# Patient Record
Sex: Male | Born: 1960 | Race: Black or African American | Hispanic: No | Marital: Single | Smoking: Former smoker
Health system: Southern US, Community
[De-identification: ages and names within clinical notes are randomized; demographics above are authoritative.]

## PROBLEM LIST (undated history)

## (undated) DIAGNOSIS — E7800 Pure hypercholesterolemia, unspecified: Secondary | ICD-10-CM

## (undated) DIAGNOSIS — E119 Type 2 diabetes mellitus without complications: Secondary | ICD-10-CM

## (undated) DIAGNOSIS — I10 Essential (primary) hypertension: Secondary | ICD-10-CM

## (undated) HISTORY — DX: Pure hypercholesterolemia, unspecified: E78.00

## (undated) HISTORY — PX: BACK SURGERY: SHX140

---

## 2006-07-06 ENCOUNTER — Emergency Department: Payer: Self-pay | Admitting: Emergency Medicine

## 2007-04-28 ENCOUNTER — Inpatient Hospital Stay (HOSPITAL_COMMUNITY): Admission: RE | Admit: 2007-04-28 | Discharge: 2007-05-01 | Payer: Self-pay | Admitting: Orthopaedic Surgery

## 2007-11-09 ENCOUNTER — Emergency Department: Payer: Self-pay | Admitting: Internal Medicine

## 2010-12-04 ENCOUNTER — Ambulatory Visit: Payer: Self-pay

## 2011-01-06 ENCOUNTER — Emergency Department: Payer: Self-pay | Admitting: Emergency Medicine

## 2011-03-13 ENCOUNTER — Emergency Department: Payer: Self-pay | Admitting: Emergency Medicine

## 2011-03-31 NOTE — Op Note (Signed)
Jack Nichols, Jack Nichols           ACCOUNT NO.:  [id number]   MEDICAL RECORD NO.:  [id number]          PATIENT TYPE:  INP   LOCATION:  2899                         FACILITY:  [facility]   PHYSICIAN:  Sharolyn Douglas, M.D.        DATE OF BIRTH:  1961-08-30   DATE OF PROCEDURE:  04/28/2007  DATE OF DISCHARGE:                               OPERATIVE REPORT   DIAGNOSIS:  L5-S1 degenerative disk disease, chronic back and bilateral  lower extremity pain.   PROCEDURES:  1. L5-S1 hemilaminotomy and diskectomy.  2. L5-S1 posterior spinal fusion.  3. L5-S1 pedicle screw instrumentation using Abbott Spine system.  4. Transforaminal lumbar interbody fusion L5-S1, with placement of      PEEK cage.  5. Local autogenous bone graft supplemented with BMP.   SURGEON:  Sharolyn Douglas, M.D.   ASSISTANT:  Verlin Fester, P.A.   ANESTHESIA:  General endotracheal.   ESTIMATED BLOOD LOSS:  100 cc.   COMPLICATIONS:  None.   NEEDLE AND SPONGE COUNT:  Correct.   INDICATIONS:  The patient is a pleasant 50 year old male with chronic  persistent back and bilateral lower extremity pain.  His imaging studies  show severe degenerative changes at L5-S1, with disc phenomenon system  and on and disc space narrowing.  He has a central disc herniation which  appears chronic.  He has failed to respond to other conservative  treatment modalities and now presents for L5-S1 decompression.  Risks,  benefits, and alternatives were reviewed.   PROCEDURE:  After informed consent, he was taken to the operating room.  He underwent general endotracheal anesthesia without difficulty and  given prophylactic IV antibiotics.  Neuro monitoring was established in  the form of the SSPs and lower extremity EMGs.  He was turned prone onto  the [place] frame.  All bony prominences were padded.  Face and eyes were  protected at all times.  Back prepped and draped in the usual sterile  fashion.  Midline incision made from L4 down to S1.   Dissection was  carried sharply through the deep fascia.  Subperiosteal exposure carried  out to the tips the transverse processes of L5 and the sacral ala  bilaterally.  Intraoperative x-ray was taken to confirm the levels.  Deep retractor was placed.  We placed pedicle screws at L5 and S1 using  anatomic probing technique.  We placed 6.5 x 45 mm screws at L5 and 7.5  x 35 mm screws in the sacrum.  The screw purchase was good, and the bone  quality was excellent.  We stimulated each pedicle screw using triggered  EMGs, there were no deleterious changes.  Before placing pedicle screws,  we palpated the pedicle tracts, and there were no breaches.  In  addition, we decorticated the transverse processes in the sacral ala in  preparation for the arthrodesis.  At this point, we turned our attention  to performing a left hemilaminectomy.  The ligamentum flavum was removed  piecemeal.  The inferior two-thirds of the spinous process and lamina  were removed using the high-speed bur and Kerrison punches.  The lateral  border of  thecal sac was identified, and the decompression was carried  out laterally flush with the pedicles.  We then undercut the lamina  across to the contralateral side.  At this point, we elected to proceed  with a transforaminal lumbar interbody fusion in order to remove the  pathologic disc at L5-S1,  improve the foraminal height, and improve the  fusion rate.  The remaining facette joint was osteotomized.  The exiting  transversing nerve roots were identified.  Free running EMGs were  monitored.  Disc space was entered, and a radical diskectomy was  completed.  The disc space was dilated up to 9 mm using intervertebral  spreaders.  The cartilaginous endplates were scraped clean.  The disc  space was then packed with local bone graft from the laminectomy along  with BMP sponges.  A 9 mm PEEK cage was packed with a BMP sponge  inserted into the interspace, tamped anteriorly  across the midline.  There were no changes in the free running EMGs.  Hemostasis was  achieved.  We placed a 40 mm titanium rod into the polyaxial screw heads  bilaterally, and compression was applied before shearing off the locking  caps.  The remaining local bone graft was packed tightly into the  lateral gutters between the L5 transverse process and the sacral ala.  A  Hemovac drain was left in place deep.  The deep fascia was closed with a  running #1 Vicryl suture.  The subcutaneous layer was closed with 2-0  Vicryl and 0 Vicryl, and then the subcuticular layer was closed with a  running 3-0 subcuticular Vicryl suture on the skin edges.  Dermabond was  applied.  Sterile dressing was applied.  The patient was turned supine,  extubated without difficulty, and transferred to recovery in stable  condition.   It should be noted that my assistant, [employer], P.A. was present  throughout procedure, including the positioning.  She then helped me  with the exposure using the Cobb elevators and the suction.  She  assisted me with the laminectomy and also with the TLIF procedure by  providing retraction of the nerve roots and providing suction where  needed.  She then assisted me with the instrumentation and fusion and  also with wound closure.  It should also be noted that intraoperative x-  ray was taken at the completion of the procedure which showed acceptable  positioning of the instrumentation at L5-S1.      Sharolyn Douglas, M.D.  Electronically Signed     MC/MEDQ  D:  04/28/2007  T:  04/29/2007  Job:  [id number]

## 2011-04-03 NOTE — Discharge Summary (Signed)
NAMECOREYON, NICOTRA           ACCOUNT NO.:  [id number]   MEDICAL RECORD NO.:  [id number]          PATIENT TYPE:  INP   LOCATION:  5030                         FACILITY:  [facility]   PHYSICIAN:  Sharolyn Douglas, M.D.        DATE OF BIRTH:  February 22, 1961   DATE OF ADMISSION:  04/28/2007  DATE OF DISCHARGE:  05/01/2007                               DISCHARGE SUMMARY   ADMITTING DIAGNOSES:  1. L5-S1 degenerative disease and chronic back pain.  2. Possible hypertension.   DISCHARGE DIAGNOSES:  1. Status post L5-S1 posterior spinal fusion.  2. Postoperative blood loss anemia which is mild.   __________ .   PROCEDURES:  On April 28, 2007 the patient was taken to the operating  room for an L5-S1 posterior spinal fusion with pedicle screws and T-lift  .  Surgeon was Dr. Sharolyn Douglas, assistant [facility] PA-C. Anesthesia  use was general.   CONSULTATION:  None.   LABORATORY DATA:  CBC with diff preop was normal with the exception of  red cells 6.23, MCV 71.2, RDW% 14.6.  Postoperatively CBC was monitored  x2 days.  Did develop mild anemia reaching low of 11.4 and 35.5 on April 29, 2007.  PT, INR and PTT preop were normal.  Complete metabolic panel  preop was normal.  Basic metabolic panel monitored x2 days  postoperatively showed glucose elevated at 131 on postop day #1 and 102  on postoperative day #2; otherwise, it was normal.  UA preop was  negative.  Blood typing was O+, antibody screen negative.  Urine  cultures showed no growth.   X-rays from June 15 showed L5-S1 posterolateral and interbody fusion.  X-  rays from June 12 were used for localization.  There is no EKG seen on  the chart.   BRIEF HISTORY:  The patient is a 50 year old male who has a long history  of problems related to his back and pain extending into bilateral lower  extremities.  His imaging studies showed severe degenerative changes at  L5-S1.  Unfortunately he failed to improve with all conservative  treatments.   Because of his MRI and x-ray findings as well as failure to  improve conservatively and his severe pain, Dr. Noel Gerold thought best  course of management would be a decompression and fusion at L5-S1.  Dr.  Noel Gerold and myself discussed risks and benefits with the patient at  length.  He has indicated understanding of the risks and opted to  proceed with surgical intervention.   HOSPITAL COURSE:  On April 28, 2007 the patient was admitted to the  hospital for the above listed procedure.  He tolerated the procedure  well and was transferred to the recovery room postoperatively in stable  condition.   Postoperatively routine orthopedic spine protocol was followed, and he  progressed along well with his postoperative protocol.   Physical therapy and occupational therapy worked with the patient on a  daily basis on a progressive ambulation program, brace use, and back  precautions.  He progressed along well with them.  He was independent  safe with all the above prior to  his discharge.   He did not develop any significant medical complications throughout his  hospital course.  By May 01, 2007 the patient had met all orthopedic  goals.  He was doing very well from a medical standpoint and had met all  goals from a physical therapy standpoint as well.   DISCHARGE/PLAN:  The patient is a 50 year old male status post L5-S1  fusion doing well.  Activity is daily ambulation program, brace on when  he is up, back precautions at all times, no lifting greater than 5  pounds.  The patient may shower.   FOLLOWUP:  Follow up two weeks postoperatively with Dr. Noel Gerold.   MEDICATIONS ON DISCHARGE:  1. Vicodin as needed for pain.  2. Robaxin as needed for muscle spasm.  3. Multivitamin daily.  4. Calcium 1200 daily.  5. Colace 100 mg twice daily as needed.  6. Laxatives as needed.  7. Avoid NSAIDs x2 months.  8. Resume home medications.   DIET:  Resume regular home diet as tolerated.   CONDITION ON  DISCHARGE:  Stable, improved.   DISPOSITION:  The patient being discharged to his home with his family  assistance as well as home health physical therapy and occupational  therapy.      Verlin Fester, P.A.      Sharolyn Douglas, M.D.  Electronically Signed    CM/MEDQ  D:  08/11/2007  T:  08/11/2007  Job:  [id number]

## 2011-09-03 LAB — URINALYSIS, ROUTINE W REFLEX MICROSCOPIC
Ketones, ur: NEGATIVE
Leukocytes, UA: NEGATIVE
Nitrite: NEGATIVE
Protein, ur: NEGATIVE

## 2011-09-03 LAB — BASIC METABOLIC PANEL
Calcium: 8.3 — ABNORMAL LOW
GFR calc Af Amer: >60
GFR calc Af Amer: >60
GFR calc non Af Amer: >60
GFR calc non Af Amer: >60
Glucose, Bld: 102 — ABNORMAL HIGH
Glucose, Bld: 131 — ABNORMAL HIGH
Potassium: 4
Sodium: 139
Sodium: 139

## 2011-09-03 LAB — CBC
HCT: 38 — ABNORMAL LOW
HCT: 44.3
Hemoglobin: 11.4 — ABNORMAL LOW
Hemoglobin: 12.3 — ABNORMAL LOW
MCV: 71.2 — ABNORMAL LOW
Platelets: 235
RBC: 4.92
RBC: 5.21
RDW: 14.7 — ABNORMAL HIGH
RDW: 14.9 — ABNORMAL HIGH
WBC: 9.9

## 2011-09-03 LAB — TYPE AND SCREEN: Antibody Screen: NEGATIVE

## 2011-09-03 LAB — COMPREHENSIVE METABOLIC PANEL
Albumin: 3.6
BUN: 13
Chloride: 109
Creatinine, Ser: 1.27
GFR calc non Af Amer: >60
Total Bilirubin: 0.7

## 2011-09-03 LAB — DIFFERENTIAL
Basophils Absolute: 0
Lymphocytes Relative: 17
Neutro Abs: 7.6

## 2011-09-03 LAB — PROTIME-INR: Prothrombin Time: 12.9

## 2011-09-03 LAB — APTT: aPTT: 30

## 2011-10-29 ENCOUNTER — Emergency Department: Payer: Self-pay | Admitting: *Deleted

## 2012-12-14 ENCOUNTER — Emergency Department: Payer: Self-pay | Admitting: Emergency Medicine

## 2012-12-14 LAB — CBC WITH DIFFERENTIAL/PLATELET
Basophil %: 0.6 %
Eosinophil #: 0.1 10*3/uL (ref 0.0–0.7)
MCHC: 31.3 g/dL — ABNORMAL LOW (ref 32.0–36.0)
Monocyte #: 1 x10 3/mm (ref 0.2–1.0)
Monocyte %: 9.3 %
Neutrophil %: 69.6 %
RBC: 6.1 10*6/uL — ABNORMAL HIGH (ref 4.40–5.90)
RDW: 14.6 % — ABNORMAL HIGH (ref 11.5–14.5)
WBC: 10.9 10*3/uL — ABNORMAL HIGH (ref 3.8–10.6)

## 2012-12-14 LAB — COMPREHENSIVE METABOLIC PANEL
Alkaline Phosphatase: 85 U/L (ref 50–136)
Anion Gap: 9 (ref 7–16)
BUN: 20 mg/dL — ABNORMAL HIGH (ref 7–18)
Co2: 24 mmol/L (ref 21–32)
Osmolality: 286 (ref 275–301)
Potassium: 4 mmol/L (ref 3.5–5.1)
SGPT (ALT): 30 U/L (ref 12–78)
Total Protein: 7.8 g/dL (ref 6.4–8.2)

## 2012-12-14 LAB — CK TOTAL AND CKMB (NOT AT ARMC)
CK, Total: 374 U/L — ABNORMAL HIGH (ref 35–232)
CK-MB: 5.2 ng/mL — ABNORMAL HIGH (ref 0.5–3.6)

## 2012-12-14 LAB — URINALYSIS, COMPLETE
Nitrite: NEGATIVE
Ph: 5 (ref 4.5–8.0)
Protein: NEGATIVE
Squamous Epithelial: NONE SEEN

## 2012-12-14 LAB — TROPONIN I: Troponin-I: <0.02 ng/mL

## 2013-04-05 DIAGNOSIS — G894 Chronic pain syndrome: Secondary | ICD-10-CM | POA: Diagnosis present

## 2013-04-05 DIAGNOSIS — M961 Postlaminectomy syndrome, not elsewhere classified: Secondary | ICD-10-CM | POA: Diagnosis present

## 2013-10-30 ENCOUNTER — Ambulatory Visit: Payer: Self-pay | Admitting: Gastroenterology

## 2015-06-12 ENCOUNTER — Encounter: Payer: Self-pay | Admitting: *Deleted

## 2015-06-12 ENCOUNTER — Emergency Department: Payer: Medicaid Other

## 2015-06-12 ENCOUNTER — Emergency Department
Admission: EM | Admit: 2015-06-12 | Discharge: 2015-06-12 | Disposition: A | Discharge status: Home / Self Care | Payer: Medicaid Other | Attending: Student | Admitting: Student

## 2015-06-12 DIAGNOSIS — Y9389 Activity, other specified: Secondary | ICD-10-CM | POA: Insufficient documentation

## 2015-06-12 DIAGNOSIS — Y92410 Unspecified street and highway as the place of occurrence of the external cause: Secondary | ICD-10-CM | POA: Diagnosis not present

## 2015-06-12 DIAGNOSIS — S40012A Contusion of left shoulder, initial encounter: Secondary | ICD-10-CM | POA: Diagnosis not present

## 2015-06-12 DIAGNOSIS — S4992XA Unspecified injury of left shoulder and upper arm, initial encounter: Secondary | ICD-10-CM | POA: Diagnosis present

## 2015-06-12 DIAGNOSIS — Y998 Other external cause status: Secondary | ICD-10-CM | POA: Insufficient documentation

## 2015-06-12 MED ORDER — CYCLOBENZAPRINE HCL 10 MG PO TABS: 10.0000 mg | ORAL_TABLET | Freq: Three times a day (TID) | ORAL | Status: DC | PRN

## 2015-06-12 MED ORDER — NAPROXEN 500 MG PO TBEC: 500.0000 mg | DELAYED_RELEASE_TABLET | Freq: Two times a day (BID) | ORAL | Status: DC

## 2015-06-12 MED ORDER — KETOROLAC TROMETHAMINE 60 MG/2ML IM SOLN
60.0000 mg | Freq: Once | INTRAMUSCULAR | Status: AC
  Administered 2015-06-12: 60 mg via INTRAMUSCULAR
  Filled 2015-06-12: qty 2
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
## 2015-06-14 ENCOUNTER — Emergency Department
Admission: EM | Admit: 2015-06-14 | Discharge: 2015-06-14 | Disposition: A | Discharge status: Home / Self Care | Payer: Medicaid Other | Attending: Emergency Medicine | Admitting: Emergency Medicine

## 2015-06-14 ENCOUNTER — Emergency Department: Payer: Medicaid Other

## 2015-06-14 ENCOUNTER — Encounter: Payer: Self-pay | Admitting: Emergency Medicine

## 2015-06-14 DIAGNOSIS — S199XXA Unspecified injury of neck, initial encounter: Secondary | ICD-10-CM | POA: Diagnosis not present

## 2015-06-14 DIAGNOSIS — Y998 Other external cause status: Secondary | ICD-10-CM | POA: Insufficient documentation

## 2015-06-14 DIAGNOSIS — Y9389 Activity, other specified: Secondary | ICD-10-CM | POA: Insufficient documentation

## 2015-06-14 DIAGNOSIS — M5030 Other cervical disc degeneration, unspecified cervical region: Secondary | ICD-10-CM

## 2015-06-14 DIAGNOSIS — Z791 Long term (current) use of non-steroidal anti-inflammatories (NSAID): Secondary | ICD-10-CM | POA: Diagnosis not present

## 2015-06-14 DIAGNOSIS — Y92410 Unspecified street and highway as the place of occurrence of the external cause: Secondary | ICD-10-CM | POA: Insufficient documentation

## 2015-06-14 DIAGNOSIS — M5032 Other cervical disc degeneration, mid-cervical region: Secondary | ICD-10-CM | POA: Insufficient documentation

## 2015-06-14 DIAGNOSIS — E119 Type 2 diabetes mellitus without complications: Secondary | ICD-10-CM | POA: Diagnosis not present

## 2015-06-14 DIAGNOSIS — I10 Essential (primary) hypertension: Secondary | ICD-10-CM | POA: Diagnosis not present

## 2015-06-14 HISTORY — DX: Essential (primary) hypertension: I10

## 2015-06-14 HISTORY — DX: Type 2 diabetes mellitus without complications: E11.9

## 2015-06-14 MED ORDER — OXYCODONE-ACETAMINOPHEN 5-325 MG PO TABS
1.0000 | ORAL_TABLET | Freq: Once | ORAL | Status: AC
  Administered 2015-06-14: 1 via ORAL

## 2015-06-14 MED ORDER — OXYCODONE-ACETAMINOPHEN 5-325 MG PO TABS
ORAL_TABLET | ORAL | Status: AC
  Administered 2015-06-14: 1 via ORAL
  Filled 2015-06-14: qty 1
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
## 2015-06-14 NOTE — ED Notes (Signed)
Patient with no complaints at this time. Respirations even and unlabored. Skin warm/dry. Discharge instructions reviewed with patient at this time. Patient given opportunity to voice concerns/ask questions. Patient discharged at this time and left Emergency Department with steady gait.   
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
## 2015-06-14 NOTE — Discharge Instructions (Signed)

## 2015-06-14 NOTE — ED Notes (Signed)
CT cancelled due to pt being seen and evaluated 2 days ago for same thing. Collar left in place.

## 2015-08-09 ENCOUNTER — Other Ambulatory Visit: Payer: Self-pay | Admitting: Student

## 2015-08-09 DIAGNOSIS — M5412 Radiculopathy, cervical region: Secondary | ICD-10-CM

## 2015-08-27 ENCOUNTER — Ambulatory Visit
Admission: RE | Admit: 2015-08-27 | Discharge: 2015-08-27 | Disposition: A | Discharge status: Home / Self Care | Payer: Medicaid Other | Source: Ambulatory Visit | Attending: Student | Admitting: Student

## 2015-08-27 DIAGNOSIS — M4802 Spinal stenosis, cervical region: Secondary | ICD-10-CM | POA: Diagnosis not present

## 2015-08-27 DIAGNOSIS — M50323 Other cervical disc degeneration at C6-C7 level: Secondary | ICD-10-CM | POA: Diagnosis not present

## 2015-08-27 DIAGNOSIS — M5412 Radiculopathy, cervical region: Secondary | ICD-10-CM

## 2020-02-01 ENCOUNTER — Ambulatory Visit: Payer: Medicaid Other | Attending: Internal Medicine

## 2020-02-01 DIAGNOSIS — Z23 Encounter for immunization: Secondary | ICD-10-CM

## 2020-02-01 NOTE — Progress Notes (Signed)
   Covid-19 Vaccination Clinic  Name:  Jack Nichols    MRN: [id number] DOB: 03-Aug-1961  02/01/2020  [name] was observed post Covid-19 immunization for 15 minutes without incident. He was provided with Vaccine Information Sheet and instruction to access the V-Safe system.   [name] was instructed to call 911 with any severe reactions post vaccine: Marland Kitchen Difficulty breathing  . Swelling of face and throat  . A fast heartbeat  . A bad rash all over body  . Dizziness and weakness   Immunizations Administered    Name Date Dose VIS Date Route   Pfizer COVID-19 Vaccine 02/01/2020  9:53 AM 0.3 mL 10/27/2019 Intramuscular   Manufacturer: [employer], [employer]   Lot: [id number]   NDC: [address]-5

## 2020-02-26 ENCOUNTER — Ambulatory Visit: Payer: Medicaid Other | Attending: Internal Medicine

## 2020-02-26 DIAGNOSIS — Z23 Encounter for immunization: Secondary | ICD-10-CM

## 2020-02-26 NOTE — Progress Notes (Signed)
   Covid-19 Vaccination Clinic  Name:  Jack Nichols    MRN: [id number] DOB: Apr 20, 1961  02/26/2020  [name] was observed post Covid-19 immunization for 15 minutes without incident. He was provided with Vaccine Information Sheet and instruction to access the V-Safe system.   [name] was instructed to call 911 with any severe reactions post vaccine: Marland Kitchen Difficulty breathing  . Swelling of face and throat  . A fast heartbeat  . A bad rash all over body  . Dizziness and weakness   Immunizations Administered    Name Date Dose VIS Date Route   Pfizer COVID-19 Vaccine 02/26/2020  9:59 AM 0.3 mL 10/27/2019 Intramuscular   Manufacturer: [employer], [employer]   Lot: [id number]   NDC: [address]-1

## 2021-02-04 ENCOUNTER — Emergency Department
Admission: EM | Admit: 2021-02-04 | Discharge: 2021-02-04 | Disposition: A | Discharge status: Home / Self Care | Payer: Medicaid Other | Attending: Emergency Medicine | Admitting: Emergency Medicine

## 2021-02-04 ENCOUNTER — Encounter: Payer: Self-pay | Admitting: Emergency Medicine

## 2021-02-04 DIAGNOSIS — M5442 Lumbago with sciatica, left side: Secondary | ICD-10-CM | POA: Insufficient documentation

## 2021-02-04 DIAGNOSIS — G8929 Other chronic pain: Secondary | ICD-10-CM | POA: Diagnosis not present

## 2021-02-04 DIAGNOSIS — E119 Type 2 diabetes mellitus without complications: Secondary | ICD-10-CM | POA: Diagnosis not present

## 2021-02-04 DIAGNOSIS — M545 Low back pain, unspecified: Secondary | ICD-10-CM | POA: Diagnosis present

## 2021-02-04 DIAGNOSIS — I10 Essential (primary) hypertension: Secondary | ICD-10-CM | POA: Diagnosis not present

## 2021-02-04 DIAGNOSIS — M5441 Lumbago with sciatica, right side: Secondary | ICD-10-CM | POA: Insufficient documentation

## 2021-02-04 MED ORDER — KETOROLAC TROMETHAMINE 60 MG/2ML IM SOLN
30.0000 mg | Freq: Once | INTRAMUSCULAR | Status: AC
  Administered 2021-02-04: 30 mg via INTRAMUSCULAR
  Filled 2021-02-04: qty 2

## 2021-02-04 MED ORDER — DIAZEPAM 5 MG PO TABS: 5.0000 mg | ORAL_TABLET | Freq: Three times a day (TID) | ORAL | 0 refills | Status: DC | PRN

## 2021-02-04 MED ORDER — DIAZEPAM 5 MG PO TABS
5.0000 mg | ORAL_TABLET | Freq: Once | ORAL | Status: AC
  Administered 2021-02-04: 5 mg via ORAL
  Filled 2021-02-04: qty 1

## 2021-02-04 MED ORDER — LIDOCAINE 5 % EX PTCH
1.0000 | MEDICATED_PATCH | CUTANEOUS | Status: DC
  Administered 2021-02-04: 1 via TRANSDERMAL
  Filled 2021-02-04: qty 1

## 2021-02-04 MED ORDER — LIDOCAINE 5 % EX PTCH: 1.0000 | MEDICATED_PATCH | CUTANEOUS | 0 refills | Status: DC

## 2021-02-04 NOTE — ED Triage Notes (Signed)
Pt arrived via EMS from home where he lives alone and called after getting out of bed and reportedly twisted body and felt pull in lower back. Pt reports pain since across lower back. Pt with chronic back back and walks with a cane.

## 2021-02-04 NOTE — Discharge Instructions (Signed)
Continue to take the pain medicine prescribed to you by your pain clinic doctor.  You may take Valium 3 times daily as needed for muscle spasms.  Apply Lidocaine patch daily.  Return to the ER for worsening symptoms, persistent vomiting, losing control of your bowel or bladder, extremity weakness, or other concerns.

## 2021-02-04 NOTE — ED Provider Notes (Signed)
-----------------------------------------   7:53 AM on 02/04/2021 -----------------------------------------  Patient states he is feeling better he is asking to go home.  Patient has someone coming to pick him up.  We will discharge the patient home.   Minna Antis, MD 02/04/21 [phone]

## 2021-02-04 NOTE — ED Provider Notes (Signed)
[facility] Emergency Department Provider Note   ____________________________________________   Event Date/Time   First MD Initiated Contact with Patient 02/04/21 0559     (approximate)  I have reviewed the triage vital signs and the nursing notes.   HISTORY  Chief Complaint Leg Pain    HPI Jack Nichols is a 60 y.o. male brought to the ER via EMS from home with a chief complaint of acute on chronic lower back pain.  Patient has a history of chronic back pain followed by [facility] pain clinic.  He is prescribed OxyContin 10 mg tid, gabapentin and Flexeril.  States he bent down to pick up some coffee and twisted his body.  Denies fall or striking the floor.  Reports pain across his lower back shooting into his legs.  Took his pain medicine and a Flexeril without relief of symptoms.  Denies bowel or bladder incontinence, extremity weakness.     Past Medical History:  Diagnosis Date  . Diabetes mellitus without complication (HCC)   . Hypertension     There are no problems to display for this patient.   History reviewed. No pertinent surgical history.  Prior to Admission medications   Medication Sig Start Date End Date Taking? Authorizing Provider  diazepam (VALIUM) 5 MG tablet Take 1 tablet (5 mg total) by mouth every 8 (eight) hours as needed for anxiety. 02/04/21  Yes Irean Hong, MD  lidocaine (LIDODERM) 5 % Place 1 patch onto the skin daily. Remove & Discard patch within 12 hours or as directed by MD 02/04/21  Yes Irean Hong, MD  cyclobenzaprine (FLEXERIL) 10 MG tablet Take 1 tablet (10 mg total) by mouth every 8 (eight) hours as needed for muscle spasms. 06/12/15   Beers, Charmayne Sheer, PA-C  naproxen (EC NAPROSYN) 500 MG EC tablet Take 1 tablet (500 mg total) by mouth 2 (two) times daily with a meal. 06/12/15   Beers, Charmayne Sheer, PA-C    Allergies Patient has no known allergies.  History reviewed. No pertinent family history.  Social  History Social History   Tobacco Use  . Smoking status: Never Smoker  . Smokeless tobacco: Never Used  Substance Use Topics  . Alcohol use: No    Review of Systems  Constitutional: No fever/chills Eyes: No visual changes. ENT: No sore throat. Cardiovascular: Denies chest pain. Respiratory: Denies shortness of breath. Gastrointestinal: No abdominal pain.  No nausea, no vomiting.  No diarrhea.  No constipation. Genitourinary: Negative for dysuria. Musculoskeletal: Positive for back pain. Skin: Negative for rash. Neurological: Negative for headaches, focal weakness or numbness.   ____________________________________________   PHYSICAL EXAM:  VITAL SIGNS: ED Triage Vitals [02/04/21 0346]  Enc Vitals Group     BP (!) 187/105     Pulse Rate 94     Resp 16     Temp 98.7 F (37.1 C)     Temp Source Oral     SpO2 95 %     Weight      Height      Head Circumference      Peak Flow      Pain Score      Pain Loc      Pain Edu?      Excl. in GC?     Constitutional: Alert and oriented. Well appearing and in mild acute distress. Eyes: Conjunctivae are normal. PERRL. EOMI. Head: Atraumatic. Nose: No congestion/rhinnorhea. Mouth/Throat: Mucous membranes are moist.  Oropharynx non-erythematous. Neck: No stridor.  Cardiovascular: Normal rate, regular rhythm. Grossly normal heart sounds.  Good peripheral circulation. Respiratory: Normal respiratory effort.  No retractions. Lungs CTAB. Gastrointestinal: Obese.  Soft and nontender to light or deep palpation. No distention. No abdominal bruits. No CVA tenderness. Musculoskeletal: No spinal tenderness to palpation.  Bilateral paraspinal lumbar muscle spasms noted.  Negative straight leg raise bilaterally.  No lower extremity tenderness nor edema.  No joint effusions. Neurologic:  Normal speech and language. No gross focal neurologic deficits are appreciated.  Ambulates with cane. Skin:  Skin is warm, dry and intact. No rash  noted. Psychiatric: Mood and affect are normal. Speech and behavior are normal.  ____________________________________________   LABS (all labs ordered are listed, but only abnormal results are displayed)  Labs Reviewed - No data to display ____________________________________________  EKG  None ____________________________________________  RADIOLOGY I, Zeke Aker J, personally viewed and evaluated these images (plain radiographs) as part of my medical decision making, as well as reviewing the written report by the radiologist.  ED MD interpretation: None  Official radiology report(s): No results found.  ____________________________________________   PROCEDURES  Procedure(s) performed (including Critical Care):  Procedures   ____________________________________________   INITIAL IMPRESSION / ASSESSMENT AND PLAN / ED COURSE  As part of my medical decision making, I reviewed the following data within the electronic MEDICAL RECORD NUMBER Nursing notes reviewed and incorporated, Old chart reviewed and Notes from prior ED visits     60 year old male presenting with acute on chronic lower back pain.  Will administer IM Toradol, oral Valium, lidocaine patch and reassess.  Clinical Course as of 02/04/21 0659  Tue Feb 04, 2021  0658 Patient just received his medications.  Care transferred to oncoming provider for pain reassessment.  Would discharge home with prescription for Valium and lidocaine patch to use as needed.  Patient has OxyContin prescribed to him by [facility] pain clinic.  Strict return precautions given.  Patient verbalizes understanding agrees with plan of care. [JS]    Clinical Course User Index [JS] Irean Hong, MD     ____________________________________________   FINAL CLINICAL IMPRESSION(S) / ED DIAGNOSES  Final diagnoses:  Acute bilateral low back pain with bilateral sciatica  Chronic bilateral low back pain without sciatica     ED Discharge Orders          Ordered    diazepam (VALIUM) 5 MG tablet  Every 8 hours PRN        02/04/21 0608    lidocaine (LIDODERM) 5 %  Every 24 hours        02/04/21 0630          *Please note:  Jack Nichols was evaluated in Emergency Department on 02/04/2021 for the symptoms described in the history of present illness. He was evaluated in the context of the global COVID-19 pandemic, which necessitated consideration that the patient might be at risk for infection with the SARS-CoV-2 virus that causes COVID-19. Institutional protocols and algorithms that pertain to the evaluation of patients at risk for COVID-19 are in a state of rapid change based on information released by regulatory bodies including the CDC and federal and state organizations. These policies and algorithms were followed during the patient's care in the ED.  Some ED evaluations and interventions may be delayed as a result of limited staffing during and the pandemic.*   Note:  This document was prepared using Dragon voice recognition software and may include unintentional dictation errors.   Irean Hong, MD 02/04/21 [phone]

## 2021-05-29 ENCOUNTER — Observation Stay
Admission: EM | Admit: 2021-05-29 | Discharge: 2021-05-30 | Disposition: A | Discharge status: Home / Self Care | Payer: Medicaid Other | Attending: Internal Medicine | Admitting: Internal Medicine

## 2021-05-29 ENCOUNTER — Emergency Department: Payer: Medicaid Other

## 2021-05-29 ENCOUNTER — Encounter: Payer: Self-pay | Admitting: Emergency Medicine

## 2021-05-29 ENCOUNTER — Observation Stay: Payer: Medicaid Other

## 2021-05-29 ENCOUNTER — Other Ambulatory Visit: Payer: Self-pay

## 2021-05-29 DIAGNOSIS — Z20822 Contact with and (suspected) exposure to covid-19: Secondary | ICD-10-CM | POA: Insufficient documentation

## 2021-05-29 DIAGNOSIS — M961 Postlaminectomy syndrome, not elsewhere classified: Secondary | ICD-10-CM | POA: Diagnosis present

## 2021-05-29 DIAGNOSIS — G894 Chronic pain syndrome: Secondary | ICD-10-CM | POA: Diagnosis not present

## 2021-05-29 DIAGNOSIS — I639 Cerebral infarction, unspecified: Secondary | ICD-10-CM | POA: Diagnosis present

## 2021-05-29 DIAGNOSIS — I10 Essential (primary) hypertension: Secondary | ICD-10-CM

## 2021-05-29 DIAGNOSIS — E1122 Type 2 diabetes mellitus with diabetic chronic kidney disease: Secondary | ICD-10-CM | POA: Insufficient documentation

## 2021-05-29 DIAGNOSIS — G459 Transient cerebral ischemic attack, unspecified: Secondary | ICD-10-CM | POA: Diagnosis not present

## 2021-05-29 DIAGNOSIS — Z79899 Other long term (current) drug therapy: Secondary | ICD-10-CM | POA: Diagnosis not present

## 2021-05-29 DIAGNOSIS — I129 Hypertensive chronic kidney disease with stage 1 through stage 4 chronic kidney disease, or unspecified chronic kidney disease: Secondary | ICD-10-CM | POA: Insufficient documentation

## 2021-05-29 DIAGNOSIS — R200 Anesthesia of skin: Secondary | ICD-10-CM | POA: Diagnosis present

## 2021-05-29 DIAGNOSIS — I5032 Chronic diastolic (congestive) heart failure: Secondary | ICD-10-CM

## 2021-05-29 DIAGNOSIS — N189 Chronic kidney disease, unspecified: Secondary | ICD-10-CM | POA: Diagnosis not present

## 2021-05-29 DIAGNOSIS — E119 Type 2 diabetes mellitus without complications: Secondary | ICD-10-CM

## 2021-05-29 DIAGNOSIS — N179 Acute kidney failure, unspecified: Secondary | ICD-10-CM | POA: Diagnosis not present

## 2021-05-29 DIAGNOSIS — E876 Hypokalemia: Secondary | ICD-10-CM | POA: Diagnosis not present

## 2021-05-29 LAB — DIFFERENTIAL
Abs Immature Granulocytes: 0.04 10*3/uL (ref 0.00–0.07)
Basophils Absolute: 0 10*3/uL (ref 0.0–0.1)
Basophils Relative: 0 %
Eosinophils Absolute: 0 10*3/uL (ref 0.0–0.5)
Eosinophils Relative: 0 %
Immature Granulocytes: 0 %
Lymphocytes Relative: 17 %
Lymphs Abs: 2.2 10*3/uL (ref 0.7–4.0)
Monocytes Absolute: 1.2 10*3/uL — ABNORMAL HIGH (ref 0.1–1.0)
Monocytes Relative: 9 %
Neutro Abs: 9.9 10*3/uL — ABNORMAL HIGH (ref 1.7–7.7)
Neutrophils Relative %: 74 %
Smear Review: NORMAL

## 2021-05-29 LAB — COMPREHENSIVE METABOLIC PANEL
ALT: 26 U/L (ref 0–44)
AST: 27 U/L (ref 15–41)
Albumin: 3.8 g/dL (ref 3.5–5.0)
Alkaline Phosphatase: 61 U/L (ref 38–126)
Anion gap: 12 (ref 5–15)
BUN: 30 mg/dL — ABNORMAL HIGH (ref 6–20)
CO2: 22 mmol/L (ref 22–32)
Calcium: 9.1 mg/dL (ref 8.9–10.3)
Chloride: 107 mmol/L (ref 98–111)
Creatinine, Ser: 2.2 mg/dL — ABNORMAL HIGH (ref 0.61–1.24)
GFR, Estimated: 34 mL/min — ABNORMAL LOW (ref >60)
Glucose, Bld: 122 mg/dL — ABNORMAL HIGH (ref 70–99)
Potassium: 3.1 mmol/L — ABNORMAL LOW (ref 3.5–5.1)
Sodium: 141 mmol/L (ref 135–145)
Total Bilirubin: 0.6 mg/dL (ref 0.3–1.2)
Total Protein: 7.8 g/dL (ref 6.5–8.1)

## 2021-05-29 LAB — PROTIME-INR
INR: 1 (ref 0.8–1.2)
Prothrombin Time: 12.7 seconds (ref 11.4–15.2)

## 2021-05-29 LAB — RESP PANEL BY RT-PCR (FLU A&B, COVID) ARPGX2
Influenza A by PCR: NEGATIVE
Influenza B by PCR: NEGATIVE
SARS Coronavirus 2 by RT PCR: NEGATIVE

## 2021-05-29 LAB — CBC
HCT: 42.9 % (ref 39.0–52.0)
Hemoglobin: 12.7 g/dL — ABNORMAL LOW (ref 13.0–17.0)
MCH: 20.4 pg — ABNORMAL LOW (ref 26.0–34.0)
MCHC: 29.6 g/dL — ABNORMAL LOW (ref 30.0–36.0)
MCV: 68.9 fL — ABNORMAL LOW (ref 80.0–100.0)
Platelets: 304 10*3/uL (ref 150–400)
RBC: 6.23 MIL/uL — ABNORMAL HIGH (ref 4.22–5.81)
RDW: 20.2 % — ABNORMAL HIGH (ref 11.5–15.5)
WBC: 13.4 10*3/uL — ABNORMAL HIGH (ref 4.0–10.5)
nRBC: 0 % (ref 0.0–0.2)

## 2021-05-29 LAB — CBG MONITORING, ED: Glucose-Capillary: 119 mg/dL — ABNORMAL HIGH (ref 70–99)

## 2021-05-29 LAB — APTT: aPTT: 28 seconds (ref 24–36)

## 2021-05-29 LAB — TROPONIN I (HIGH SENSITIVITY): Troponin I (High Sensitivity): 8 ng/L (ref <18)

## 2021-05-29 LAB — MAGNESIUM: Magnesium: 2 mg/dL (ref 1.7–2.4)

## 2021-05-29 MED ORDER — OXYCODONE HCL ER 10 MG PO T12A
10.0000 mg | EXTENDED_RELEASE_TABLET | Freq: Three times a day (TID) | ORAL | Status: DC
  Administered 2021-05-29 – 2021-05-30 (×3): 10 mg via ORAL
  Filled 2021-05-29 (×4): qty 1

## 2021-05-29 MED ORDER — LIDOCAINE 5 % EX PTCH
1.0000 | MEDICATED_PATCH | Freq: Every day | CUTANEOUS | Status: DC | PRN
  Filled 2021-05-29: qty 1

## 2021-05-29 MED ORDER — ASPIRIN 300 MG RE SUPP: 300.0000 mg | Freq: Every day | RECTAL | Status: DC

## 2021-05-29 MED ORDER — ASPIRIN 325 MG PO TABS
325.0000 mg | ORAL_TABLET | Freq: Every day | ORAL | Status: DC
  Filled 2021-05-29: qty 1

## 2021-05-29 MED ORDER — ACETAMINOPHEN 160 MG/5ML PO SOLN
650.0000 mg | ORAL | Status: DC | PRN
  Filled 2021-05-29: qty 20.3

## 2021-05-29 MED ORDER — SENNOSIDES-DOCUSATE SODIUM 8.6-50 MG PO TABS: 1.0000 | ORAL_TABLET | Freq: Every evening | ORAL | Status: DC | PRN

## 2021-05-29 MED ORDER — INSULIN ASPART 100 UNIT/ML IJ SOLN
0.0000 [IU] | Freq: Three times a day (TID) | INTRAMUSCULAR | Status: DC
  Administered 2021-05-30: 09:00:00 2 [IU] via SUBCUTANEOUS
  Filled 2021-05-29: qty 1

## 2021-05-29 MED ORDER — IOHEXOL 350 MG/ML SOLN
75.0000 mL | Freq: Once | INTRAVENOUS | Status: AC | PRN
  Administered 2021-05-29: 75 mL via INTRAVENOUS

## 2021-05-29 MED ORDER — STROKE: EARLY STAGES OF RECOVERY BOOK: Freq: Once | Status: DC

## 2021-05-29 MED ORDER — GABAPENTIN 300 MG PO CAPS
300.0000 mg | ORAL_CAPSULE | Freq: Three times a day (TID) | ORAL | Status: DC
  Administered 2021-05-29 – 2021-05-30 (×3): 300 mg via ORAL
  Filled 2021-05-29 (×3): qty 1

## 2021-05-29 MED ORDER — CYCLOBENZAPRINE HCL 10 MG PO TABS: 10.0000 mg | ORAL_TABLET | Freq: Three times a day (TID) | ORAL | Status: DC | PRN

## 2021-05-29 MED ORDER — ATORVASTATIN CALCIUM 20 MG PO TABS
80.0000 mg | ORAL_TABLET | Freq: Every day | ORAL | Status: DC
  Administered 2021-05-30: 09:00:00 80 mg via ORAL
  Filled 2021-05-29 (×2): qty 4

## 2021-05-29 MED ORDER — SODIUM CHLORIDE 0.9 % IV SOLN: INTRAVENOUS | Status: DC

## 2021-05-29 MED ORDER — POTASSIUM CHLORIDE CRYS ER 20 MEQ PO TBCR
40.0000 meq | EXTENDED_RELEASE_TABLET | Freq: Once | ORAL | Status: AC
  Administered 2021-05-29: 40 meq via ORAL
  Filled 2021-05-29: qty 2

## 2021-05-29 MED ORDER — ACETAMINOPHEN 650 MG RE SUPP: 650.0000 mg | RECTAL | Status: DC | PRN

## 2021-05-29 MED ORDER — SODIUM CHLORIDE 0.9 % IV BOLUS
1000.0000 mL | Freq: Once | INTRAVENOUS | Status: AC
  Administered 2021-05-29: 1000 mL via INTRAVENOUS

## 2021-05-29 MED ORDER — ASPIRIN 325 MG PO TABS
650.0000 mg | ORAL_TABLET | Freq: Once | ORAL | Status: AC
  Administered 2021-05-29: 650 mg via ORAL
  Filled 2021-05-29: qty 2

## 2021-05-29 MED ORDER — ACETAMINOPHEN 325 MG PO TABS: 650.0000 mg | ORAL_TABLET | ORAL | Status: DC | PRN

## 2021-05-29 MED ORDER — ASPIRIN EC 81 MG PO TBEC
81.0000 mg | DELAYED_RELEASE_TABLET | Freq: Every day | ORAL | Status: DC
  Administered 2021-05-30: 09:00:00 81 mg via ORAL
  Filled 2021-05-29: qty 1

## 2021-05-29 MED ORDER — CLOPIDOGREL BISULFATE 75 MG PO TABS
75.0000 mg | ORAL_TABLET | Freq: Every day | ORAL | Status: DC
  Administered 2021-05-30: 75 mg via ORAL
  Filled 2021-05-29: qty 1

## 2021-05-29 MED ORDER — ENOXAPARIN SODIUM 80 MG/0.8ML IJ SOSY
0.5000 mg/kg | PREFILLED_SYRINGE | INTRAMUSCULAR | Status: DC
  Administered 2021-05-29: 77.5 mg via SUBCUTANEOUS
  Filled 2021-05-29 (×2): qty 0.78

## 2021-05-29 MED ORDER — CLOPIDOGREL BISULFATE 75 MG PO TABS
300.0000 mg | ORAL_TABLET | Freq: Once | ORAL | Status: AC
  Administered 2021-05-29: 300 mg via ORAL
  Filled 2021-05-29: qty 4

## 2021-05-29 MED ORDER — ENOXAPARIN SODIUM 40 MG/0.4ML IJ SOSY: 40.0000 mg | PREFILLED_SYRINGE | INTRAMUSCULAR | Status: DC

## 2021-05-29 NOTE — H&P (Signed)
History and Physical   Jack Nichols [id number] DOB: 08/07/1961 DOA: 05/29/2021  PCP: Oswaldo ConroyBender, Abby Daneele, MD   Patient coming from: Home  Chief Complaint: Dizziness, numbness  HPI: Jack BakeSylvester O Soberanes is a 60 y.o. male with medical history significant of hypertension, diabetes, chronic pain who presents with dizziness and numbness.  Patient arrived as a code stroke after experiencing sudden onset dizziness and left-sided numbness.  This occurred while he was mowing today he states that his leg felt funny and his hand felt numb he also noted some abnormal sensation at the left side of his mouth.  He was diaphoretic at the time however he had been mowing and high temperature.  Initially he felt back to baseline in the ED but then he had some lower extremity symptoms return later while in the ED. He denies fevers, chills, chest pain, shortness of breath, abdominal pain, constipation, diarrhea, nausea, vomiting.  ED Course: Vital signs in the ED significant for heart rate in the 100s and blood pressure in the 100s to 130s.  Lab work-up showed CMP with potassium 3.1 and creatinine elevated to 2.2 up from previous of 1.2 however this was 8 years ago.  CBC shows leukocytosis to 13.4 and hemoglobin stable at 12.7.  PT, PTT, INR within normal limits.  Troponin normal.  Respiratory panel for flu and COVID pending.  CT head showed no acute abnormality.  CT angiogram of the head and neck showed patent carotid arteries, moderate vertebral atherosclerosis, intracranial atherosclerosis with multifocal stenosis with significant stenosis at the right M2 vessel.  Neurology consulted as a part of code stroke and patient was evaluated by telemetry neurologist.  Neurology will be following patient.  Review of Systems: As per HPI otherwise all other systems reviewed and are negative.  Past Medical History:  Diagnosis Date   Diabetes mellitus without complication (HCC)    Hypertension     Past Surgical  History:  Procedure Laterality Date   BACK SURGERY      Social History  reports that he has never smoked. He has never used smokeless tobacco. He reports that he does not drink alcohol. No history on file for drug use.  No Known Allergies  Family History  Problem Relation Age of Onset   Hypertension Mother    Hypertension Father   Reviewed on admission   Prior to Admission medications   Medication Sig Start Date End Date Taking? Authorizing Provider  cyclobenzaprine (FLEXERIL) 10 MG tablet Take 1 tablet (10 mg total) by mouth every 8 (eight) hours as needed for muscle spasms. 06/12/15   Beers, Charmayne Sheerharles M, PA-C  diazepam (VALIUM) 5 MG tablet Take 1 tablet (5 mg total) by mouth every 8 (eight) hours as needed for anxiety. 02/04/21   Irean HongSung, Jade J, MD  lidocaine (LIDODERM) 5 % Place 1 patch onto the skin daily. Remove & Discard patch within 12 hours or as directed by MD 02/04/21   Irean HongSung, Jade J, MD  naproxen (EC NAPROSYN) 500 MG EC tablet Take 1 tablet (500 mg total) by mouth 2 (two) times daily with a meal. 06/12/15   Evangeline DakinBeers, Charles M, PA-C    Physical Exam: Vitals:   05/29/21 2000 05/29/21 2015 05/29/21 2030 05/29/21 2045  BP: 130/81     Pulse: 96 (!) 120 (!) 102 96  Resp: 13 (!) 25 18   Temp:      TempSrc:      SpO2: 91% 98% 97% 96%  Weight:  Height:       Physical Exam Constitutional:      General: He is not in acute distress.    Appearance: Normal appearance. He is obese.  HENT:     Head: Normocephalic and atraumatic.     Mouth/Throat:     Mouth: Mucous membranes are moist.     Pharynx: Oropharynx is clear.  Eyes:     Extraocular Movements: Extraocular movements intact.     Pupils: Pupils are equal, round, and reactive to light.  Cardiovascular:     Rate and Rhythm: Normal rate and regular rhythm.     Pulses: Normal pulses.     Heart sounds: Normal heart sounds.  Pulmonary:     Effort: Pulmonary effort is normal. No respiratory distress.     Breath sounds: Normal  breath sounds.  Abdominal:     General: Bowel sounds are normal. There is no distension.     Palpations: Abdomen is soft.     Tenderness: There is no abdominal tenderness.  Musculoskeletal:        General: No swelling or deformity.  Skin:    General: Skin is warm and dry.  Neurological:     Comments: Mental Status: Patient is awake, alert, oriented x3 No signs of aphasia or neglect Cranial Nerves: II: Pupils equal, round, and reactive to light.   III,IV, VI: EOMI without ptosis or diploplia.  V: Facial sensation is symmetric to light touch. VII: Facial movement is symmetric.  VIII: hearing is intact to voice X: Uvula elevates symmetrically XI: Shoulder shrug is symmetric. XII: tongue is midline without atrophy or fasciculations.  Motor: Good effort thorughout, at Least 5/5 bilateral UE, 5/5 left lower extremity, 4+/5 right lower extremity.  Sensory: Sensation is grossly intact bilateral UEs & LEs Cerebellar: Finger-Nose intact bilalat     Labs on Admission: I have personally reviewed following labs and imaging studies  CBC: Recent Labs  Lab 05/29/21 1838  WBC 13.4*  NEUTROABS 9.9*  HGB 12.7*  HCT 42.9  MCV 68.9*  PLT 304    Basic Metabolic Panel: Recent Labs  Lab 05/29/21 1838  NA 141  K 3.1*  CL 107  CO2 22  GLUCOSE 122*  BUN 30*  CREATININE 2.20*  CALCIUM 9.1  MG 2.0    GFR: Estimated Creatinine Clearance: 57.5 mL/min (A) (by C-G formula based on SCr of 2.2 mg/dL (H)).  Liver Function Tests: Recent Labs  Lab 05/29/21 1838  AST 27  ALT 26  ALKPHOS 61  BILITOT 0.6  PROT 7.8  ALBUMIN 3.8    Urine analysis:    Component Value Date/Time   COLORURINE Yellow 12/14/2012 0352   COLORURINE YELLOW 04/25/2007 1101   APPEARANCEUR Clear 12/14/2012 0352   LABSPEC 1.032 12/14/2012 0352   PHURINE 5.0 12/14/2012 0352   PHURINE 6.0 04/25/2007 1101   GLUCOSEU Negative 12/14/2012 0352   HGBUR Negative 12/14/2012 0352   HGBUR NEGATIVE 04/25/2007 1101    BILIRUBINUR Negative 12/14/2012 0352   KETONESUR Trace 12/14/2012 0352   KETONESUR NEGATIVE 04/25/2007 1101   PROTEINUR Negative 12/14/2012 0352   PROTEINUR NEGATIVE 04/25/2007 1101   UROBILINOGEN 1 04/25/2007 1101   NITRITE Negative 12/14/2012 0352   NITRITE NEGATIVE 04/25/2007 1101   LEUKOCYTESUR Negative 12/14/2012 0352    Radiological Exams on Admission: CT HEAD CODE STROKE WO CONTRAST  Result Date: 05/29/2021 CLINICAL DATA:  Code stroke. Neuro deficit, acute, stroke suspected. Additional provided: Sudden onset dizziness and numbness on left side. EXAM: CT HEAD WITHOUT CONTRAST  TECHNIQUE: Contiguous axial images were obtained from the base of the skull through the vertex without intravenous contrast. COMPARISON:  No pertinent prior exams available for comparison. FINDINGS: Brain: Mild generalized cerebral atrophy. Mild patchy and ill-defined hypoattenuation within the cerebral white matter, nonspecific but compatible chronic small vessel ischemic disease. There is no acute intracranial hemorrhage. No demarcated cortical infarct. No extra-axial fluid collection. No evidence of an intracranial mass. No midline shift. Vascular: No hyperdense vessel.  Atherosclerotic calcifications Skull: Normal. Negative for fracture or focal lesion. Sinuses/Orbits: Visualized orbits show no acute finding. Mild mucosal thickening within the partially imaged bilateral maxillary sinuses. Chronic medially displaced fracture deformity of the left lamina papyracea. ASPECTS (Alberta Stroke Program Early CT Score) - Ganglionic level infarction (caudate, lentiform nuclei, internal capsule, insula, M1-M3 cortex): 7 - Supraganglionic infarction (M4-M6 cortex): 3 Total score (0-10 with 10 being normal): 10 These results were called by telephone at the time of interpretation on 05/29/2021 at 6:46 pm to provider Dorothea Glassman , who verbally acknowledged these results. IMPRESSION: No evidence of acute intracranial abnormality.  ASPECTS is 10. Mild generalized cerebral atrophy and cerebral white matter chronic small vessel ischemic disease. Paranasal sinus disease at the imaged levels, as described. Electronically Signed   By: Jackey Loge DO   On: 05/29/2021 18:47   CT ANGIO HEAD NECK W WO CM (CODE STROKE)  Result Date: 05/29/2021 CLINICAL DATA:  Stroke, follow-up. Sudden onset dizziness and numbness on left side. EXAM: CT ANGIOGRAPHY HEAD AND NECK TECHNIQUE: Multidetector CT imaging of the head and neck was performed using the standard protocol during bolus administration of intravenous contrast. Multiplanar CT image reconstructions and MIPs were obtained to evaluate the vascular anatomy. Carotid stenosis measurements (when applicable) are obtained utilizing NASCET criteria, using the distal internal carotid diameter as the denominator. CONTRAST:  64mL OMNIPAQUE IOHEXOL 350 MG/ML SOLN COMPARISON:  Noncontrast head CT performed earlier today 05/29/2021. FINDINGS: CTA NECK FINDINGS Aortic arch: Common or of the innominate and left common carotid arteries. The visualized aortic arch is normal in caliber. No hemodynamically significant innominate or proximal subclavian artery stenosis. Right carotid system: CCA and ICA patent within the neck without stenosis or significant atherosclerotic disease. Left carotid system: CCA and ICA patent within the neck without stenosis or significant atherosclerotic disease. Vertebral arteries: The left vertebral artery is developmentally diminutive, but patent. The dominant right vertebral artery is patent. At least moderate atherosclerotic stenosis at the origin of the right vertebral artery. Skeleton: Cervical spondylosis. No acute bony abnormality or aggressive osseous lesion. Other neck: No neck mass or cervical lymphadenopathy. Upper chest: No consolidation within the imaged lung apices. Review of the MIP images confirms the above findings CTA HEAD FINDINGS Anterior circulation: The intracranial  internal carotid arteries are patent. Calcified plaque within both vessels with no more than mild stenosis. The M1 middle cerebral arteries are patent. No M2 proximal branch occlusion or high-grade proximal stenosis. Atherosclerotic irregularity of the M2 and more distal middle cerebral artery branches bilaterally. Most notably, there is an apparent severe stenosis within a mid to distal right M2 MCA branch (series 11, image 11). The anterior cerebral arteries are patent. Severe stenosis within the proximal A3 segment of the right anterior cerebral artery. No intracranial aneurysm is identified. Posterior circulation: The intracranial vertebral arteries are patent. The basilar artery is patent. Hypoplastic P1 segments bilaterally with sizable bilateral posterior communicating arteries. The posterior cerebral is are patent bilaterally. Atherosclerotic irregularity within both vessels, without high-grade proximal stenosis. Venous sinuses: Within  the limitations of contrast timing, no convincing thrombus. Anatomic variants: As described Review of the MIP images confirms the above findings These results were called by telephone at the time of interpretation on 05/29/2021 at 7:39 pm to provider [facility] , who verbally acknowledged these results. IMPRESSION: CTA neck: 1. The common carotid and internal carotid arteries are patent within the neck without stenosis or significant atherosclerotic disease. 2. At least moderate atherosclerotic narrowing at the origin of the dominant right vertebral artery. The left vertebral artery is developmentally diminutive, but patent throughout the neck. CTA head: 1. Intracranial atherosclerotic disease with multifocal stenoses, most notably as follows. 2. Severe stenosis within the right anterior cerebral artery proximal A3 segment. 3. Severe focal stenosis within a mid to distal M2 right MCA vessel. 4. Atherosclerotic irregularity of the bilateral posterior cerebral arteries without  high-grade proximal stenosis. Electronically Signed   By: Jackey Loge DO   On: 05/29/2021 19:39    EKG: Independently reviewed.  Sinus tachycardia at 108 bpm.  Some nonspecific ST and T wave abnormalities in inferior leads with T wave flattening and some T wave inversion.  Also in lateral leads.  Flattening/inversion more pronounced compared to previous 5 years ago.  Assessment/Plan Principal Problem:   Acute CVA (cerebrovascular accident) (HCC) Active Problems:   Diabetes (HCC)   HTN (hypertension)   Chronic pain syndrome   Postlaminectomy syndrome, lumbar region  Acute CVA > Patient presenting with acute onset neurologic deficit consisting of dizziness and left-sided numbness of the hand leg and mouth.  Symptoms initially resolved but has had some recurrence of symptoms in the ED. > CT head without acute unreality but CT a head and neck did show intracranial atherosclerosis with multi focal stenosis with significant stenosis at the right M2 segment. > Evaluated by teleneurology in the ED and neurology will be following inpatient. - Appreciate neurology recommendations - Allow for permissive HTN (systolic < 220 and diastolic < 120) - ASA 650 mg, then 81 mg daily - Plavix 300 mg, then 75 mg daily - Atorvastatin 80 mg daily - Echocardiogram  - A1C  - Lipid panel  - Tele monitoring  - SLP eval - PT/OT  Hypokalemia AKI versus CKD > Creatinine elevated to 2.2 in the ED this is up from previous measurement of 1.2 but this was a years ago. > Also was in the setting of doing yard work and sweating now so may represent AKI.  Mild tachycardia also supports this. > Potassium also noted to be 3.1.  Received 40 mEq KCl in the ED. - We will give 1 L bolus and started on a rate of IV fluids. - Avoid nephrotoxic agents - Trend renal function and electrolytes - Check magnesium  Diabetes - SSI   Hypertension > On 10 mg of lisinopril and combination lisinopril hydrochlorothiazide 20-25  mg -Holding antihypertensives in the setting of permissive hypertension as above.  Chronic pain > Follows with [facility] pain clinic for chronic pain and postlaminectomy syndrome. - Continue home extended release oxycodone 10 mg every 8 hours - Continue home gabapentin 300 mg 3 times daily - Continue Flexeril 10 mg 3 times daily as needed  DVT prophylaxis: Lovenox  Code Status:   Full  Family Communication:  None on admission.  States his family has already been updated and he does not need me to give them a call. Disposition Plan:   Patient is from:  Home  Anticipated DC to:  Home  Anticipated DC date:  1  to 2 days  Anticipated DC barriers: None  Consults called:  Neurology, Dr. Wilford Corner, consulted by EDP  Admission status:  Observation, telemetry   Severity of Illness: The appropriate patient status for this patient is OBSERVATION. Observation status is judged to be reasonable and necessary in order to provide the required intensity of service to ensure the patient's safety. The patient's presenting symptoms, physical exam findings, and initial radiographic and laboratory data in the context of their medical condition is felt to place them at decreased risk for further clinical deterioration. Furthermore, it is anticipated that the patient will be medically stable for discharge from the hospital within 2 midnights of admission. The following factors support the patient status of observation.   " The patient's presenting symptoms include numbness, dizziness. " The physical exam findings include left lower extremity weakness, obesity. " The initial radiographic and laboratory data are Lab work-up showed CMP with potassium 3.1 and creatinine elevated to 2.2 up from previous of 1.2 however this was 8 years ago.  CBC shows leukocytosis to 13.4 and hemoglobin stable at 12.7.  PT, PTT, INR within normal limits.  Troponin normal.  Respiratory panel for flu and COVID pending.  CT head showed no acute  abnormality.  CT angiogram of the head and neck showed patent carotid arteries, moderate vertebral atherosclerosis, intracranial atherosclerosis with multifocal stenosis with significant stenosis at the right M2 vessel. Synetta Fail MD [facility]  How to contact the [facility] Attending or Consulting provider 7A - 7P or covering provider during after hours 7P -7A, for this patient?   Check the care team in [facility] and look for a) attending/consulting [facility] provider listed and b) the [facility] team listed Log into [email or web address] and use [place]'s universal password to access. If you do not have the password, please contact the hospital operator. Locate the [facility] provider you are looking for under [facility] and page to a number that you can be directly reached. If you still have difficulty reaching the provider, please page the [facility] (Director on Call) for the Hospitalists listed on amion for assistance.  05/29/2021, 9:54 PM

## 2021-05-29 NOTE — ED Provider Notes (Signed)
[facility] Emergency Department Provider Note   ____________________________________________   Event Date/Time   First MD Initiated Contact with Patient 05/29/21 1833     (approximate)  I have reviewed the triage vital signs and the nursing notes.   HISTORY  Chief Complaint Dizziness, Numbness, and Code Stroke    HPI Jack Nichols is a 60 y.o. male with past medical history of hypertension and diabetes who presents to the ED complaining of dizziness and numbness.  Patient reports that he had been outside mowing his lawn for about 30 minutes when he started to feel dizzy and lightheaded.  At the same time, he began to notice a feeling of numbness in his left hand and left leg along with the left side of his mouth.  He now feels like the symptoms have improved and he denies any weakness in his extremities.  He has not had any changes in his vision or speech.  He states he has been feeling well for the past couple of days with no fevers, cough, chest pain, shortness of breath, vomiting, or diarrhea.  He does not currently take any blood thinners.        Past Medical History:  Diagnosis Date   Diabetes mellitus without complication (HCC)    Hypertension     Patient Active Problem List   Diagnosis Date Noted   Acute CVA (cerebrovascular accident) (HCC) 05/29/2021    Past Surgical History:  Procedure Laterality Date   BACK SURGERY      Prior to Admission medications   Medication Sig Start Date End Date Taking? Authorizing Provider  cyclobenzaprine (FLEXERIL) 10 MG tablet Take 1 tablet (10 mg total) by mouth every 8 (eight) hours as needed for muscle spasms. 06/12/15   Beers, Charmayne Sheer, PA-C  diazepam (VALIUM) 5 MG tablet Take 1 tablet (5 mg total) by mouth every 8 (eight) hours as needed for anxiety. 02/04/21   Irean Hong, MD  lidocaine (LIDODERM) 5 % Place 1 patch onto the skin daily. Remove & Discard patch within 12 hours or as directed by MD  02/04/21   Irean Hong, MD  naproxen (EC NAPROSYN) 500 MG EC tablet Take 1 tablet (500 mg total) by mouth 2 (two) times daily with a meal. 06/12/15   Beers, Charmayne Sheer, PA-C    Allergies Patient has no known allergies.  History reviewed. No pertinent family history.  Social History Social History   Tobacco Use   Smoking status: Never   Smokeless tobacco: Never  Substance Use Topics   Alcohol use: No    Review of Systems  Constitutional: No fever/chills Eyes: No visual changes. ENT: No sore throat. Cardiovascular: Denies chest pain.  Positive for dizziness and lightheadedness. Respiratory: Denies shortness of breath. Gastrointestinal: No abdominal pain.  No nausea, no vomiting.  No diarrhea.  No constipation. Genitourinary: Negative for dysuria. Musculoskeletal: Negative for back pain. Skin: Negative for rash. Neurological: Negative for headaches, focal weakness or numbness.  ____________________________________________   PHYSICAL EXAM:  VITAL SIGNS: ED Triage Vitals  Enc Vitals Group     BP 05/29/21 1819 (!) 103/49     Pulse Rate 05/29/21 1819 98     Resp 05/29/21 1819 20     Temp 05/29/21 1819 97.9 F (36.6 C)     Temp Source 05/29/21 1819 Oral     SpO2 05/29/21 1819 95 %     Weight 05/29/21 1820 (!) 340 lb (154.2 kg)     Height  05/29/21 1820 6\' 2"  (1.88 m)     Head Circumference --      Peak Flow --      Pain Score 05/29/21 1820 0     Pain Loc --      Pain Edu? --      Excl. in GC? --     Constitutional: Alert and oriented. Eyes: Conjunctivae are normal. Head: Atraumatic. Nose: No congestion/rhinnorhea. Mouth/Throat: Mucous membranes are moist. Neck: Normal ROM Cardiovascular: Normal rate, regular rhythm. Grossly normal heart sounds. Respiratory: Normal respiratory effort.  No retractions. Lungs CTAB. Gastrointestinal: Soft and nontender. No distention. Genitourinary: deferred Musculoskeletal: No lower extremity tenderness nor edema. Neurologic:   Normal speech and language.  4+ out of 5 strength in left lower extremity, otherwise 5 out of 5 strength in right lower extremity and bilateral upper extremities. Skin:  Skin is warm, dry and intact. No rash noted. Psychiatric: Mood and affect are normal. Speech and behavior are normal.  ____________________________________________   LABS (all labs ordered are listed, but only abnormal results are displayed)  Labs Reviewed  CBC - Abnormal; Notable for the following components:      Result Value   WBC 13.4 (*)    RBC 6.23 (*)    Hemoglobin 12.7 (*)    MCV 68.9 (*)    MCH 20.4 (*)    MCHC 29.6 (*)    RDW 20.2 (*)    All other components within normal limits  DIFFERENTIAL - Abnormal; Notable for the following components:   Neutro Abs 9.9 (*)    Monocytes Absolute 1.2 (*)    All other components within normal limits  COMPREHENSIVE METABOLIC PANEL - Abnormal; Notable for the following components:   Potassium 3.1 (*)    Glucose, Bld 122 (*)    BUN 30 (*)    Creatinine, Ser 2.20 (*)    GFR, Estimated 34 (*)    All other components within normal limits  CBG MONITORING, ED - Abnormal; Notable for the following components:   Glucose-Capillary 119 (*)    All other components within normal limits  RESP PANEL BY RT-PCR (FLU A&B, COVID) ARPGX2  PROTIME-INR  APTT  TROPONIN I (HIGH SENSITIVITY)   ____________________________________________  EKG  ED ECG REPORT I, 05/31/21, the attending physician, personally viewed and interpreted this ECG.   Date: 05/29/2021  EKG Time: 18:21  Rate: 108  Rhythm: sinus tachycardia  Axis: Normal  Intervals:none  ST&T Change: Inferolateral T wave inversions   PROCEDURES  Procedure(s) performed (including Critical Care):  Procedures   ____________________________________________   INITIAL IMPRESSION / ASSESSMENT AND PLAN / ED COURSE      60 year old male with past medical history of hypertension, hyperlipidemia, and diabetes who  presents to the ED with acute onset lightheadedness and left-sided numbness while mowing his lawn outside.  Patient now feels like symptoms are improving and numbness as well as lightheadedness has resolved, although he appears to have some subtle weakness in his left lower extremity.  Code stroke was called and CT head is negative for acute process, patient evaluated by neurology and we will perform CTA of his head and neck.  CTA is negative for large vessel occlusion, but does show significant stenosis in an area that would correspond with patient's symptoms.  Labs are unremarkable and we will admit for further stroke work-up.      ____________________________________________   FINAL CLINICAL IMPRESSION(S) / ED DIAGNOSES  Final diagnoses:  Cerebrovascular accident (CVA), unspecified mechanism (HCC)  ED Discharge Orders     None        Note:  This document was prepared using Dragon voice recognition software and may include unintentional dictation errors.    Chesley Noon, MD 05/29/21 2020

## 2021-05-29 NOTE — ED Notes (Signed)
Cardiac monitoring continued. Pt. Resting in bed. Denies pain or stroke symptoms currently. Telestroke at bedside

## 2021-05-29 NOTE — ED Notes (Signed)
CODE STROKE 

## 2021-05-29 NOTE — ED Notes (Addendum)
Symptoms coming back- gait different and pulling on left hand. Patient in room.

## 2021-05-29 NOTE — ED Notes (Signed)
ED Provider at bedside. 

## 2021-05-29 NOTE — ED Notes (Signed)
[place], EDP and Tele-neurologist at bedside assessing patient.

## 2021-05-29 NOTE — ED Notes (Signed)
Stretcher replaced with hospital bed, pt. Passed stroke swallow screen, provided ice water. Denies further need at this time.

## 2021-05-29 NOTE — ED Notes (Signed)
teleneuro speaking with pt. At bedside.

## 2021-05-29 NOTE — ED Notes (Signed)
Requested [place] sandwich from charge RN for pt.

## 2021-05-29 NOTE — ED Triage Notes (Signed)
Pt comes into the ED via POV c/o episode of sudden onset dizziness and numbness on the left side while he was mowing today.  PT states his leg started feeling funny, left hand was numb, and he felt like the left side of his mouth felt different as well.  Pt states he also was very diaphoretic when this happened.  Pt states he is now back to baseline with no deficits noted.  Pt states he has diabetes, HTn, and high cholesterol.  Pt has clear speech at this time and negative stroke screen currently.

## 2021-05-29 NOTE — Consult Note (Addendum)
[facility] Telemedicine Consult   Requesting Provider: Dr. Larinda Buttery, ED provider at [facility] Consult Participants: Dr. Marthe Patch, Telespecialist RN   Bedside RN Location of the provider: Home Location of the patient: [facility]  This consult was provided via telemedicine with 2-way video and audio communication. The patient/family was informed that care would be provided in this way and agreed to receive care in this manner.    Chief Complaint: left sided weakness  HPI: Jack Nichols is a 60 year old man who has a past medical history significant for diabetes, hypertension, hyperlipidemia, obesity, presenting to the emergency room for evaluation of sudden onset of dizziness and left-sided weakness. He reports that he was mowing his lawn this afternoon and had been out for about 30 minutes when right at around 5 PM he had a sudden onset of dizziness which he describes as lightheadedness.  Along with that he started noticing some left arm and leg weakness and tightness. Symptoms were concerning enough for him to come to the emergency room.  On initial evaluation, he was negative on the stroke screen but while he was being helped to the bed and within the next few minutes, his symptoms started to return again and a code stroke was activated. He was seen by telemedicine consultation in the emergency room at [facility]. On further history taking he reports that he has had similar symptoms a few weeks ago that lasted for short while and resolved. Does not report of any numbness but reports tightness/heaviness of the left leg. Does not report of any blurred vision or headache.  No chest pain shortness of breath nausea vomiting.  Does not report prior illness or sickness preceding this presentation.   Past Medical History:  Diagnosis Date   Diabetes mellitus without complication (HCC)    Hypertension     No current facility-administered medications for this  encounter.  Current Outpatient Medications:    cyclobenzaprine (FLEXERIL) 10 MG tablet, Take 1 tablet (10 mg total) by mouth every 8 (eight) hours as needed for muscle spasms., Disp: 30 tablet, Rfl: 1   diazepam (VALIUM) 5 MG tablet, Take 1 tablet (5 mg total) by mouth every 8 (eight) hours as needed for anxiety., Disp: 15 tablet, Rfl: 0   lidocaine (LIDODERM) 5 %, Place 1 patch onto the skin daily. Remove & Discard patch within 12 hours or as directed by MD, Disp: 30 patch, Rfl: 0   naproxen (EC NAPROSYN) 500 MG EC tablet, Take 1 tablet (500 mg total) by mouth 2 (two) times daily with a meal., Disp: 60 tablet, Rfl: 0    LKW: 5 PM today tpa given?: No, no stroke scale at 1 and then rapidly improving symptoms IR Thrombectomy? No, exam not consistent with LVO and no LVO seen on vessel imaging Modified Rankin Scale: 0-Completely asymptomatic and back to baseline post- stroke Time of teleneurologist evaluation: 1820 hrs  Exam: Vitals:   05/29/21 1819  BP: (!) 103/49  Pulse: 98  Resp: 20  Temp: 97.9 F (36.6 C)  SpO2: 95%    General: Well-developed well-nourished obese man in no distress HEENT: Normocephalic/atraumatic CVS: Regular rate and rhythm on the monitor Respiratory: Breathing well saturating normally on room air Extremities appear well-perfused over the camera-unable to check for edema. Neurological exam Is awake alert oriented x3 No dysarthria No aphasia Cranial nerves: Pupils equal round reactive to light, extraocular movements appear intact, visual fields are full, facial sensation is intact to touch without extinction, face is  symmetric, tongue and palate midline. Motor examination with barely minimal drift of the left leg otherwise intact. Sensation intact light touch without extinction on double times in the stimulation Coordination with no dysmetria in the upper or lower extremities Initial telemedicine neurology NIH stroke scale is 1 for left leg drift.  A few  minutes after his imaging was completed, repeat exam with no left leg drift.  Repeat NIH stroke scale 0.  Imaging Reviewed:  CT head with no acute changes.  Aspects 10.  No bleed Taken emergently for CT angiogram due to concerns for posterior circulation stroke-CT angiogram of the head and neck reveals moderate atherosclerotic narrowing of the right vertebral artery without significant stenosis or atherosclerotic disease in the anterior circulation. CT angiography of the head intracranially shows atherosclerotic disease with multifocal stenoses with severe right ACA A3 segment stenosis and severe focal stenosis within the mid to distal M2 right MCA blood vessel.  There is also atherosclerotic irregularity of the bilateral PCAs without high-grade proximal stenosis.  There is no emergent LVO.  Labs reviewed in epic and pertinent values follow: CBC    Component Value Date/Time   WBC 10.9 (H) 12/14/2012 0352   WBC 13.8 (H) 04/30/2007 0350   RBC 6.10 (H) 12/14/2012 0352   RBC 5.21 04/30/2007 0350   HGB 14.1 12/14/2012 0352   HCT 45.1 12/14/2012 0352   PLT 204 12/14/2012 0352   MCV 74 (L) 12/14/2012 0352   MCH 23.1 (L) 12/14/2012 0352   MCHC 31.3 (L) 12/14/2012 0352   MCHC 32.2 04/30/2007 0350   RDW 14.6 (H) 12/14/2012 0352   LYMPHSABS 2.1 12/14/2012 0352   MONOABS 1.0 12/14/2012 0352   EOSABS 0.1 12/14/2012 0352   BASOSABS 0.1 12/14/2012 0352   CMP     Component Value Date/Time   NA 142 12/14/2012 0352   K 4.0 12/14/2012 0352   CL 109 (H) 12/14/2012 0352   CO2 24 12/14/2012 0352   GLUCOSE 104 (H) 12/14/2012 0352   BUN 20 (H) 12/14/2012 0352   CREATININE 1.21 12/14/2012 0352   CALCIUM 8.7 12/14/2012 0352   PROT 7.8 12/14/2012 0352   ALBUMIN 3.5 12/14/2012 0352   AST 33 12/14/2012 0352   ALT 30 12/14/2012 0352   ALKPHOS 85 12/14/2012 0352   BILITOT 0.1 (L) 12/14/2012 0352   GFRNONAA >60 12/14/2012 0352   GFRAA >60 12/14/2012 0352     Assessment: 60 year old man presenting  for evaluation of dizziness that he describes as lightheadedness followed by left-sided weakness with waxing and waning symptoms with the worst NIH stroke scale of 1 and symptoms rapidly improving to an NIH stroke scale of 0. tPA not given due to low stroke scale to begin with and rapidly improving symptoms. Head and neck vessel imaging performed due to concern for posterior circulation stenosis/occlusion and that confirms a multifocal stenoses in the posterior circulation as well as in the right MCA which can explain the left-sided symptoms. Likely symptoms are exacerbated due to working outside on a hot day and possibly being hypotensive-systolic blood pressure was 105-has a history of hypertension, so relatively hypotensive at presentation. History of similar symptoms a few weeks ago that resolved. Symptoms concerning for a posterior circulation or right cerebral stroke versus TIA. I would recommend admission for risk factor work-up.   Recommendations:  Admit to hospitalist I would recommend dual antiplatelets-load with aspirin 650 and Plavix 300 today Aspirin 81 and Plavix 75 from tomorrow.  Continue dual antiplatelets for 3 months. Atorvastatin 80  mg now and daily MRI of the brain without contrast 2D echocardiogram A1c Lipid panel Frequent neurochecks Telemetry PT OT Speech therapy I would recommend permissive hypertension-do not treat unless systolic blood pressures greater than 220 and if it goes greater than 220 at that time also treat on a as needed basis for the next 48 to 72 hours.  On discharge, goal blood pressure has to be 140/90 or below. I will follow him in person tomorrow.  Plan discussed with ED provider Dr. Larinda Buttery  This patient is receiving care for possible acute neurological changes. There was 80 minutes of care by this provider at the time of service, including time for direct evaluation via telemedicine, review of medical records, imaging studies and discussion of  findings with providers, the patient and/or family.  -- Jack Dikes, MD [facility] Pager: [phone] If 7pm to 7am, please call on call as listed on AMION.

## 2021-05-29 NOTE — Progress Notes (Signed)
PHARMACIST - PHYSICIAN COMMUNICATION  CONCERNING:  Enoxaparin (Lovenox) for DVT Prophylaxis    RECOMMENDATION: Patient was prescribed enoxaprin 40mg  q24 hours for VTE prophylaxis.   Filed Weights   05/29/21 1820  Weight: (!) 154.2 kg (340 lb)    Body mass index is 43.65 kg/m.  Estimated Creatinine Clearance: 57.5 mL/min (A) (by C-G formula based on SCr of 2.2 mg/dL (H)).   Based on [facility] policy patient is candidate for enoxaparin 0.5mg /kg TBW SQ every 24 hours based on BMI being >30.   DESCRIPTION: Pharmacy has adjusted enoxaparin dose per [facility] policy.  Patient is now receiving enoxaparin 77.5 mg every 24 hours    [facility], PharmD Clinical Pharmacist  05/29/2021 8:22 PM

## 2021-05-29 NOTE — ED Notes (Signed)
Pt. In MRI.

## 2021-05-29 NOTE — ED Notes (Signed)
Pt transported to CT 2 

## 2021-05-30 ENCOUNTER — Observation Stay
Admit: 2021-05-30 | Discharge: 2021-05-30 | Disposition: A | Discharge status: Home / Self Care | Payer: Medicaid Other | Attending: Internal Medicine | Admitting: Internal Medicine

## 2021-05-30 DIAGNOSIS — G459 Transient cerebral ischemic attack, unspecified: Secondary | ICD-10-CM | POA: Diagnosis not present

## 2021-05-30 DIAGNOSIS — I10 Essential (primary) hypertension: Secondary | ICD-10-CM | POA: Diagnosis not present

## 2021-05-30 LAB — ECHOCARDIOGRAM COMPLETE
AR max vel: 3.09 cm2
AV Area VTI: 3.66 cm2
AV Area mean vel: 3.26 cm2
AV Mean grad: 3 mmHg
AV Peak grad: 6.1 mmHg
Ao pk vel: 1.23 m/s
Area-P 1/2: 4.21 cm2
Height: 74 in
S' Lateral: 2.42 cm
Weight: 5440 oz

## 2021-05-30 LAB — LIPID PANEL
Cholesterol: 107 mg/dL (ref 0–200)
HDL: 31 mg/dL — ABNORMAL LOW (ref >40)
LDL Cholesterol: 57 mg/dL (ref 0–99)
Total CHOL/HDL Ratio: 3.5 RATIO
Triglycerides: 96 mg/dL (ref <150)
VLDL: 19 mg/dL (ref 0–40)

## 2021-05-30 LAB — BASIC METABOLIC PANEL
Anion gap: 10 (ref 5–15)
BUN: 24 mg/dL — ABNORMAL HIGH (ref 6–20)
CO2: 21 mmol/L — ABNORMAL LOW (ref 22–32)
Calcium: 9 mg/dL (ref 8.9–10.3)
Chloride: 109 mmol/L (ref 98–111)
Creatinine, Ser: 1.61 mg/dL — ABNORMAL HIGH (ref 0.61–1.24)
GFR, Estimated: 49 mL/min — ABNORMAL LOW (ref >60)
Glucose, Bld: 108 mg/dL — ABNORMAL HIGH (ref 70–99)
Potassium: 4 mmol/L (ref 3.5–5.1)
Sodium: 140 mmol/L (ref 135–145)

## 2021-05-30 LAB — HEMOGLOBIN A1C
Hgb A1c MFr Bld: 7 % — ABNORMAL HIGH (ref 4.8–5.6)
Mean Plasma Glucose: 154.2 mg/dL

## 2021-05-30 LAB — GLUCOSE, CAPILLARY
Glucose-Capillary: 142 mg/dL — ABNORMAL HIGH (ref 70–99)
Glucose-Capillary: 106 mg/dL — ABNORMAL HIGH (ref 70–99)

## 2021-05-30 LAB — HIV ANTIBODY (ROUTINE TESTING W REFLEX): HIV Screen 4th Generation wRfx: NONREACTIVE

## 2021-05-30 LAB — TROPONIN I (HIGH SENSITIVITY): Troponin I (High Sensitivity): 9 ng/L (ref <18)

## 2021-05-30 MED ORDER — METOPROLOL TARTRATE 25 MG PO TABS: 25.0000 mg | ORAL_TABLET | Freq: Two times a day (BID) | ORAL | Status: DC

## 2021-05-30 MED ORDER — METOPROLOL TARTRATE 25 MG PO TABS: 25.0000 mg | ORAL_TABLET | Freq: Two times a day (BID) | ORAL | 0 refills | Status: AC

## 2021-05-30 MED ORDER — ASPIRIN 81 MG PO TBEC: 81.0000 mg | DELAYED_RELEASE_TABLET | Freq: Every day | ORAL | 0 refills | Status: DC

## 2021-05-30 MED ORDER — CLOPIDOGREL BISULFATE 75 MG PO TABS: 75.0000 mg | ORAL_TABLET | Freq: Every day | ORAL | 0 refills | Status: DC

## 2021-05-30 NOTE — Progress Notes (Signed)
Neurology Progress Note   S:// Patient seen via telemedicine yesterday by me. Reports no recurrence of left-sided symptoms Reports no dizziness    O:// Current vital signs: BP (!) 181/102 (BP Location: Left Arm)   Pulse 85   Temp 97.6 F (36.4 C)   Resp 18   Ht 6\' 2"  (1.88 m)   Wt (!) 154.2 kg   SpO2 96%   BMI 43.65 kg/m  Vital signs in last 24 hours: Temp:  [97.5 F (36.4 C)-98 F (36.7 C)] 97.6 F (36.4 C) (07/15 0825) Pulse Rate:  [69-120] 85 (07/15 0825) Resp:  [12-25] 18 (07/15 0825) BP: (103-181)/(49-102) 181/102 (07/15 0825) SpO2:  [88 %-98 %] 96 % (07/15 0825) Weight:  [154.2 kg] 154.2 kg (07/14 1820) General: Awake alert in no distress HEENT: Normocephalic/atraumatic Lungs: Clear Cardiovascular: Regular rate rhythm Abdomen soft nondistended nontender Extremities warm well perfused Neurological examination Awake alert oriented x3 No dysarthria Aphasia Cranial nerves II to XII intact Motor system with 5/5 strength in all 4 extremities Sensory examination with no loss of sensation or extinction to double simultaneous stimulation Coordination with no dysmetria NIH stroke scale-0   Medications  Current Facility-Administered Medications:     stroke: mapping our early stages of recovery book, , Does not apply, Once, 08-24-1994, MD   acetaminophen (TYLENOL) tablet 650 mg, 650 mg, Oral, Q4H PRN **OR** acetaminophen (TYLENOL) 160 MG/5ML solution 650 mg, 650 mg, Per Tube, Q4H PRN **OR** acetaminophen (TYLENOL) suppository 650 mg, 650 mg, Rectal, Q4H PRN, Synetta Fail, MD   aspirin EC tablet 81 mg, 81 mg, Oral, Daily, Synetta Fail, MD, 81 mg at 05/30/21 0912   atorvastatin (LIPITOR) tablet 80 mg, 80 mg, Oral, Daily, 06/01/21, MD, 80 mg at 05/30/21 0913   clopidogrel (PLAVIX) tablet 75 mg, 75 mg, Oral, Daily, 06/01/21, MD, 75 mg at 05/30/21 0912   cyclobenzaprine (FLEXERIL) tablet 10 mg, 10 mg, Oral, Q8H PRN, 06/01/21,  MD   enoxaparin (LOVENOX) injection 77.5 mg, 0.5 mg/kg, Subcutaneous, Q24H, Synetta Fail, MD, 77.5 mg at 05/29/21 2238   gabapentin (NEURONTIN) capsule 300 mg, 300 mg, Oral, TID, 2239, MD, 300 mg at 05/30/21 0912   insulin aspart (novoLOG) injection 0-15 Units, 0-15 Units, Subcutaneous, TID WC, 01-15-1985, MD, 2 Units at 05/30/21 0928   lidocaine (LIDODERM) 5 % 1 patch, 1 patch, Transdermal, Daily PRN, 06/01/21, MD   oxyCODONE (OXYCONTIN) 12 hr tablet 10 mg, 10 mg, Oral, Q8H, Synetta Fail, MD, 10 mg at 05/30/21 0940   senna-docusate (Senokot-S) tablet 1 tablet, 1 tablet, Oral, QHS PRN, 06/01/21, MD  Labs CBC    Component Value Date/Time   WBC 13.4 (H) 05/29/2021 1838   RBC 6.23 (H) 05/29/2021 1838   HGB 12.7 (L) 05/29/2021 1838   HGB 14.1 12/14/2012 0352   HCT 42.9 05/29/2021 1838   HCT 45.1 12/14/2012 0352   PLT 304 05/29/2021 1838   PLT 204 12/14/2012 0352   MCV 68.9 (L) 05/29/2021 1838   MCV 74 (L) 12/14/2012 0352   MCH 20.4 (L) 05/29/2021 1838   MCHC 29.6 (L) 05/29/2021 1838   RDW 20.2 (H) 05/29/2021 1838   RDW 14.6 (H) 12/14/2012 0352   LYMPHSABS 2.2 05/29/2021 1838   LYMPHSABS 2.1 12/14/2012 0352   MONOABS 1.2 (H) 05/29/2021 1838   MONOABS 1.0 12/14/2012 0352   EOSABS 0.0 05/29/2021 1838   EOSABS 0.1 12/14/2012 0352   BASOSABS 0.0  05/29/2021 1838   BASOSABS 0.1 12/14/2012 0352    CMP     Component Value Date/Time   NA 141 05/29/2021 1838   NA 142 12/14/2012 0352   K 3.1 (L) 05/29/2021 1838   K 4.0 12/14/2012 0352   CL 107 05/29/2021 1838   CL 109 (H) 12/14/2012 0352   CO2 22 05/29/2021 1838   CO2 24 12/14/2012 0352   GLUCOSE 122 (H) 05/29/2021 1838   GLUCOSE 104 (H) 12/14/2012 0352   BUN 30 (H) 05/29/2021 1838   BUN 20 (H) 12/14/2012 0352   CREATININE 2.20 (H) 05/29/2021 1838   CREATININE 1.21 12/14/2012 0352   CALCIUM 9.1 05/29/2021 1838   CALCIUM 8.7 12/14/2012 0352   PROT 7.8 05/29/2021 1838    PROT 7.8 12/14/2012 0352   ALBUMIN 3.8 05/29/2021 1838   ALBUMIN 3.5 12/14/2012 0352   AST 27 05/29/2021 1838   AST 33 12/14/2012 0352   ALT 26 05/29/2021 1838   ALT 30 12/14/2012 0352   ALKPHOS 61 05/29/2021 1838   ALKPHOS 85 12/14/2012 0352   BILITOT 0.6 05/29/2021 1838   BILITOT 0.1 (L) 12/14/2012 0352   GFRNONAA 34 (L) 05/29/2021 1838   GFRNONAA >60 12/14/2012 0352   GFRAA >60 12/14/2012 0352    glycosylated hemoglobin  Lipid Panel     Component Value Date/Time   CHOL 107 05/30/2021 0153   TRIG 96 05/30/2021 0153   HDL 31 (L) 05/30/2021 0153   CHOLHDL 3.5 05/30/2021 0153   VLDL 19 05/30/2021 0153   LDLCALC 57 05/30/2021 0153    A1c 7 LDL 57  Imaging I have reviewed images in epic and the results pertinent to this consultation are: CT head-aspects 10.  No bleed. CT angiogram head and neck reveals intracranial atherosclerotic disease with multifocal stenosis and severe right A3 and distal M2 stenosis.  In the neck, moderate atherosclerotic narrowing of the right vertebral artery without significant stenosis or atherosclerotic disease in the anterior circulation.  No emergent hospital  MRI brain with no acute infarction.  Underlying moderate chronic microvascular ischemic disease.  2d echo IMPRESSIONS   1. Left ventricular ejection fraction, by estimation, is 70 to 75%. The  left ventricle has hyperdynamic function. The left ventricle has no  regional wall motion abnormalities. There is mild concentric left ventricular hypertrophy. Left ventricular diastolic parameters are consistent with Grade III diastolic dysfunction (restrictive).   2. Right ventricular systolic function is normal. The right ventricular  size is normal.   3. The mitral valve is grossly normal. Trivial mitral valve  regurgitation.   4. The aortic valve is normal in structure. Aortic valve regurgitation is not visualized  Also reported normal LA size and no IAS  Assessment: 60 year old man  presenting for evaluation of dizziness that he describes as lightheadedness followed by left-sided weakness with waxing and waning symptoms with complete resolution of symptoms and currently also NIH stroke scale of 0. tPA not given on the telemedicine neurology evaluation that I did yesterday due to very low stroke scale and eventually complete resolution of symptoms. CT of the head and neck concerning for intracranial atherosclerosis. MRI of the brain negative for acute stroke. History of similar symptoms that resolved few weeks ago. Suspect cerebral hypoperfusion in the setting of systemic hypotension causing the symptoms. AKI probably also contributing to some symptoms but focality of symptoms makes TIA more likely.  I am glad that he decided to stay for a risk factor work-up to be completed.   Impression:  TIA, cerebral hypoperfusion AKI  Recommendations: -For stroke prevention, he needs 3 months of aspirin 81 daily and Plavix 75 daily followed by aspirin only. -Continue home dose of atorvastatin 40. -Follow PT OT recommendations -Discussed the importance of exercising and healthy diet as it pertains to cardioprotective and cerebral protective effects.  He will discuss weight management strategies with his primary care.  Follow-up with outpatient neurology  Discussed my plan with Dr. Allena Katz at the bedside. Discussed the plan with the patient-answered all his questions.  Please call inpatient neurology with questions as needed.  -- Milon Dikes, MD Neurologist [facility] Pager: [phone]

## 2021-05-30 NOTE — Progress Notes (Signed)
OT Cancellation Note  Patient Details Name: COURVOISIER HAMBLEN MRN: [id number] DOB: 04/12/1961   Cancelled Treatment:    Reason Eval/Treat Not Completed: OT screened, no needs identified, will sign off. Consult received, chart reviewed. Pt independent with all aspects of ADL, mobility. Denies difficulty. With brief MMT, L shoulder flexion 4-/5 to 4/5, otherwise no deficits appreciated. Pt briefly educated in strengthening for L shoulder. No additional skilled OT needs at this time. Will sign off.   Wynona Canes, MPH, MS, OTR/L ascom [phone] 05/30/21, 10:43 AM

## 2021-05-30 NOTE — Progress Notes (Signed)
PT Cancellation Note  Patient Details Name: Jack Nichols MRN: [id number] DOB: April 29, 1961   Cancelled Treatment:    Reason Eval/Treat Not Completed:  (Consult received and chart reviewed. Patient up in bathroom indep (without assist device) at initial presentation to room, ambulating in room upon return.  Endorses strength, balance and mobility are baseline for him; no safety needs, functional deficits noted with in-room mobility.  Will complete PT order at this time, as no acute PT needs identified.  Please re-consult should needs change.)  [name] H. Manson Passey, PT, DPT, NCS 05/30/21, 10:44 AM [phone]

## 2021-05-30 NOTE — Progress Notes (Signed)
*  PRELIMINARY RESULTS* Echocardiogram 2D Echocardiogram has been performed.  Cristela Blue 05/30/2021, 11:39 AM

## 2021-05-30 NOTE — TOC Progression Note (Signed)
Transition of Care [facility]) - Progression Note    Patient Details  Name: Jack Nichols MRN: [id number] Date of Birth: 18-Jul-1961  [facility]) CM/SW Contact  Caryn Section, RN Phone Number: 05/30/2021, 11:31 AM  Clinical Narrative:   Patient lives at home alone, but states he has assistance from family/friends if needed.  He has no concerns transporting to appointments or getting medications.  He reports that he takes his medications as prescribed.  He does not have current home services, and does not feel he requires them at this time.  Patient feels safe to discharge home today.         Expected Discharge Plan and Services                                                 Social Determinants of Health (SDOH) Interventions    Readmission Risk Interventions No flowsheet data found.

## 2021-05-30 NOTE — Progress Notes (Signed)
SLP Cancellation Note  Patient Details Name: Jack Nichols MRN: [id number] DOB: [birth year]   Cancelled treatment:       Reason Eval/Treat Not Completed: SLP screened, no needs identified, will sign off (chart reviewed; consulted NSG then met w/ pt) Pt denied any difficulty swallowing and is currently on a regular diet; tolerates swallowing pills w/ water per NSG. Pt conversed in conversation w/ SLP and on telephone answering questions w/out expressive/receptive deficits noted; pt denied any speech-language deficits. Speech clear. No further skilled ST services indicated as pt appears at his baseline. Pt agreed. NSG to reconsult if any change in status while admitted.       Orinda Kenner, MS, CCC-SLP Speech Language Pathologist Rehab Services [phone] [facility] 05/30/2021, 11:54 AM

## 2021-05-31 ENCOUNTER — Other Ambulatory Visit: Payer: Self-pay | Admitting: Internal Medicine

## 2021-05-31 DIAGNOSIS — I679 Cerebrovascular disease, unspecified: Secondary | ICD-10-CM

## 2021-05-31 NOTE — Discharge Summary (Signed)
[facility] Discharge Summary   Patient: Jack Nichols WUJ:[id number]  PCP: Oswaldo Conroy, MD  Date of admission: 05/29/2021   Date of discharge: 05/30/2021      Discharge Diagnoses:  Principal diagnosis TIA, no CVA Active Problems:   Diabetes (HCC)   HTN (hypertension)   Chronic pain syndrome   Postlaminectomy syndrome, lumbar region Intracranial stenosis  Admitted From: Home Disposition:  Home   Recommendations for Outpatient Follow-up:  PCP: Follow-up with PCP in 1 week. Establish care with cardiology for hyperdynamic circulation. Follow up LABS/TEST: BMP   Follow-up Information     Bender, Earl Lagos, MD. Schedule an appointment as soon as possible for a visit in 1 week(s).   Specialty: Family Medicine Why: get referral to cardiology and BMP checked. Contact information: [address] [place] [address] [phone]                Discharge Instructions     Ambulatory referral to Cardiology   Complete by: As directed    Diet - low sodium heart healthy   Complete by: As directed    Increase activity slowly   Complete by: As directed        Diet recommendation: Cardiac diet  Activity: The patient is advised to gradually reintroduce usual activities, as tolerated  Discharge Condition: stable  Code Status: Full code   History of present illness: As per the H and P dictated on admission, "Jack Nichols is a 60 y.o. male with medical history significant of hypertension, diabetes, chronic pain who presents with dizziness and numbness.             Patient arrived as a code stroke after experiencing sudden onset dizziness and left-sided numbness.  This occurred while he was mowing today he states that his leg felt funny and his hand felt numb he also noted some abnormal sensation at the left side of his mouth.  He was diaphoretic at the time however he had been mowing and high temperature.  Initially he felt back to  baseline in the ED but then he had some lower extremity symptoms return later while in the ED. He denies fevers, chills, chest pain, shortness of breath, abdominal pain, constipation, diarrhea, nausea, vomiting.   ED Course: Vital signs in the ED significant for heart rate in the 100s and blood pressure in the 100s to 130s.  Lab work-up showed CMP with potassium 3.1 and creatinine elevated to 2.2 up from previous of 1.2 however this was 8 years ago.  CBC shows leukocytosis to 13.4 and hemoglobin stable at 12.7.  PT, PTT, INR within normal limits.  Troponin normal.  Respiratory panel for flu and COVID pending.  CT head showed no acute abnormality.  CT angiogram of the head and neck showed patent carotid arteries, moderate vertebral atherosclerosis, intracranial atherosclerosis with multifocal stenosis with significant stenosis at the right M2 vessel.  Neurology consulted as a part of code stroke and patient was evaluated by telemetry neurologist.  Neurology will be following patient."  Hospital Course:  Summary of his active problems in the hospital is as following.   TIA Cerebral hypoperfusion No CVA. Presented with complaints of dizziness and lightheadedness as well as left-sided weakness. Blood pressure was soft initially. tPA was not given. CT head negative for any acute abnormality. CT angiogram concerning for significant intracranial atherosclerosis. MRI brain negative for acute CVA. Passed swallowing evaluation. PT OT recommends home no therapy on discharge. Neurology was consulted. Suspect  cerebral hypoperfusion in the setting of hypotension causing patient's symptoms. Outpatient referral to neurology. Aspirin and Plavix for 3 months followed by aspirin alone. Continue Lipitor home regimen.  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 Grade 2 diastolic dysfunction. Concern for hyperdynamic circulation. We will initiate patient on  beta-blocker. Outpatient follow-up with cardiology recommended.  Essential hypertension. Blood pressure currently elevated. Patient is actually on lisinopril and HCTZ at home but given his presentation with AKI we will discontinue this medication. Initiated on Lopressor given echocardiogram showing hyperdynamic circulation.  Hypokalemia AKI Creatinine elevated to 2.2 in the ED this is up from previous measurement of 1.2 but this was a years ago. Also was in the setting of doing yard work and sweating now so may represent AKI.  Mild tachycardia also supports this. Potassium also noted to be 3.1. Received 40 mEq KCl in the ED.   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 Continue home regimen at discharge.  Chronic pain Continue home regimen.  Morbid obesity. Recommend sleep apnea evaluation as well as weight loss. Body mass index is 43.65 kg/m.   Patient was seen by physical therapy, who recommended No therapy needed on discharge. On the day of the discharge the patient's vitals were stable, and no other new acute medical condition were reported. The patient was felt safe to be discharge at Home with no therapy needed on discharge.  Consultants: Neurology  Procedures: Echocardiogram   DISCHARGE MEDICATION: Allergies as of 05/30/2021       Reactions   Morphine Itching   Itching, nausea        Medication List     STOP taking these medications    lisinopril 10 MG tablet Commonly known as: ZESTRIL   lisinopril-hydrochlorothiazide 20-25 MG tablet Commonly known as: ZESTORETIC       TAKE these medications    aspirin 81 MG EC tablet Take 1 tablet (81 mg total) by mouth daily. Swallow whole.   atorvastatin 80 MG tablet Commonly known as: LIPITOR Take 80 mg by mouth daily.   clopidogrel 75 MG tablet Commonly known as: PLAVIX Take 1 tablet (75 mg total) by mouth daily. Notes to patient: This medication ma increase  risk of bleed. If you notice abnormal bleeding or black tarry stool please contact doctor.   gabapentin 300 MG capsule Commonly known as: NEURONTIN Take 300 mg by mouth 3 (three) times daily.   metFORMIN 500 MG tablet Commonly known as: GLUCOPHAGE Take 500 mg by mouth daily.   metoprolol tartrate 25 MG tablet Commonly known as: LOPRESSOR Take 1 tablet (25 mg total) by mouth 2 (two) times daily.   OxyCONTIN 10 mg 12 hr tablet Generic drug: oxyCODONE Take 10 mg by mouth every 8 (eight) hours.       ASK your doctor about these medications    cyclobenzaprine 10 MG tablet Commonly known as: Flexeril Take 1 tablet (10 mg total) by mouth every 8 (eight) hours as needed for muscle spasms.   diazepam 5 MG tablet Commonly known as: Valium Take 1 tablet (5 mg total) by mouth every 8 (eight) hours as needed for anxiety.   lidocaine 5 % Commonly known as: LIDODERM Place 1 patch onto the skin daily. Remove & Discard patch within 12 hours or as directed by MD        Discharge Exam: Filed Weights   05/29/21 1820  Weight: (!) 154.2 kg   Vitals:   05/30/21 0511 05/30/21  0825  BP: (!) 152/86 (!) 181/102  Pulse: 69 85  Resp: 16 18  Temp: (!) 97.5 F (36.4 C) 97.6 F (36.4 C)  SpO2: 95% 96%   General: Appear in mild distress, no Rash; Oral Mucosa Clear, moist. no Abnormal Neck Mass Or lumps, Conjunctiva normal  Cardiovascular: S1 and S2 Present, no Murmur, Respiratory: good respiratory effort, Bilateral Air entry present and CTA, no Crackles, no wheezes Abdomen: Bowel Sound present, Soft and no tenderness Extremities: no Pedal edema Neurology: alert and oriented to time, place, and person affect appropriate. no new focal deficit Gait not checked due to patient safety concerns  The results of significant diagnostics from this hospitalization (including imaging, microbiology, ancillary and laboratory) are listed below for reference.    Significant Diagnostic Studies: MR  BRAIN WO CONTRAST  Result Date: 05/29/2021 CLINICAL DATA:  Initial evaluation for acute dizziness, weakness. EXAM: MRI HEAD WITHOUT CONTRAST TECHNIQUE: Multiplanar, multiecho pulse sequences of the brain and surrounding structures were obtained without intravenous contrast. COMPARISON:  Prior CT from earlier the same day. FINDINGS: Brain: Cerebral volume within normal limits for age. Scattered patchy T2/FLAIR hyperintensity involving the periventricular and deep white matter both cerebral hemispheres most consistent with chronic small vessel ischemic disease, moderate in nature. Few scatter remote lacunar infarcts present about the bilateral basal ganglia and thalami. No abnormal foci of restricted diffusion to suggest acute or subacute ischemia. Gray-white matter differentiation maintained. No encephalomalacia to suggest chronic cortical infarction. No evidence for acute or chronic intracranial hemorrhage. No mass lesion, midline shift or mass effect. No hydrocephalus or extra-axial fluid collection. Pituitary gland suprasellar region normal. Midline structures intact. Vascular: Major intracranial vascular flow voids are maintained. Skull and upper cervical spine: Craniocervical junction within normal limits. Upper cervical spine normal. Bone marrow signal intensity within normal limits. No acute scalp soft tissue abnormality. Sinuses/Orbits: Globes and orbital soft tissues demonstrate no acute finding. Scattered mucosal thickening noted within the ethmoidal air cells and maxillary sinuses. No mastoid effusion. Inner ear structures grossly normal. Other: None. IMPRESSION: 1. No acute intracranial infarct or other abnormality. 2. Underlying moderate chronic microvascular ischemic disease. Electronically Signed   By: Rise Mu M.D.   On: 05/29/2021 23:21   ECHOCARDIOGRAM COMPLETE  Result Date: 05/30/2021    ECHOCARDIOGRAM REPORT   Patient Name:   ZIAH LEANDRO Date of Exam: 05/30/2021 Medical  Rec #:  [id number]           Height:       74.0 in Accession #:    [id number]          Weight:       340.0 lb Date of Birth:  1960/12/04            BSA:          2.723 m Patient Age:    60 years            BP:           181/102 mmHg Patient Gender: M                   HR:           85 bpm. Exam Location:  [facility] Procedure: 2D Echo, Color Doppler and Cardiac Doppler Indications:     Stroke I63.9  History:         Patient has no prior history of Echocardiogram examinations.  Risk Factors:Hypertension and Diabetes.  Sonographer:     Cristela Blue RDCS (AE) Referring Phys:  [id number] [facility] M Rafia Shedden Diagnosing Phys: Alwyn Pea MD  Sonographer Comments: Technically challenging study due to limited acoustic windows and suboptimal apical window. IMPRESSIONS  1. Left ventricular ejection fraction, by estimation, is 70 to 75%. The left ventricle has hyperdynamic function. The left ventricle has no regional wall motion abnormalities. There is mild concentric left ventricular hypertrophy. Left ventricular diastolic parameters are consistent with Grade III diastolic dysfunction (restrictive).  2. Right ventricular systolic function is normal. The right ventricular size is normal.  3. The mitral valve is grossly normal. Trivial mitral valve regurgitation.  4. The aortic valve is normal in structure. Aortic valve regurgitation is not visualized. FINDINGS  Left Ventricle: Left ventricular ejection fraction, by estimation, is 70 to 75%. The left ventricle has hyperdynamic function. The left ventricle has no regional wall motion abnormalities. The left ventricular internal cavity size was normal in size. There is mild concentric left ventricular hypertrophy. Left ventricular diastolic parameters are consistent with Grade III diastolic dysfunction (restrictive). Right Ventricle: The right ventricular size is normal. No increase in right ventricular wall thickness. Right ventricular systolic function is normal. Left  Atrium: Left atrial size was normal in size. Right Atrium: Right atrial size was normal in size. Pericardium: There is no evidence of pericardial effusion. Mitral Valve: The mitral valve is grossly normal. There is mild thickening of the mitral valve leaflet(s). Mild mitral annular calcification. Trivial mitral valve regurgitation. Tricuspid Valve: The tricuspid valve is normal in structure. Tricuspid valve regurgitation is mild. Aortic Valve: The aortic valve is normal in structure. Aortic valve regurgitation is not visualized. Aortic valve mean gradient measures 3.0 mmHg. Aortic valve peak gradient measures 6.1 mmHg. Aortic valve area, by VTI measures 3.66 cm. Pulmonic Valve: The pulmonic valve was normal in structure. Pulmonic valve regurgitation is not visualized. Aorta: The ascending aorta was not well visualized. IAS/Shunts: No atrial level shunt detected by color flow Doppler.  LEFT VENTRICLE PLAX 2D LVIDd:         4.13 cm  Diastology LVIDs:         2.42 cm  LV e' medial:    8.05 cm/s LV PW:         1.32 cm  LV E/e' medial:  6.7 LV IVS:        1.14 cm  LV e' lateral:   15.70 cm/s LVOT diam:     2.00 cm  LV E/e' lateral: 3.4 LV SV:         77 LV SV Index:   28 LVOT Area:     3.14 cm  RIGHT VENTRICLE RV Basal diam:  3.86 cm RV S prime:     16.40 cm/s TAPSE (M-mode): 4.6 cm LEFT ATRIUM           Index       RIGHT ATRIUM           Index LA diam:      3.50 cm 1.29 cm/m  RA Area:     18.90 cm LA Vol (A4C): 67.7 ml 24.86 ml/m RA Volume:   52.50 ml  19.28 ml/m  AORTIC VALVE                   PULMONIC VALVE AV Area (Vmax):    3.09 cm    PV Vmax:        0.84 m/s AV Area (Vmean):   3.26 cm  PV Peak grad:   2.8 mmHg AV Area (VTI):     3.66 cm    RVOT Peak grad: 2 mmHg AV Vmax:           123.00 cm/s AV Vmean:          84.200 cm/s AV VTI:            0.211 m AV Peak Grad:      6.1 mmHg AV Mean Grad:      3.0 mmHg LVOT Vmax:         121.00 cm/s LVOT Vmean:        87.500 cm/s LVOT VTI:          0.246 m LVOT/AV VTI  ratio: 1.17  AORTA Ao Root diam: 3.40 cm MITRAL VALVE               TRICUSPID VALVE MV Area (PHT): 4.21 cm    TR Peak grad:   7.4 mmHg MV Decel Time: 180 msec    TR Vmax:        136.00 cm/s MV E velocity: 53.60 cm/s MV A velocity: 87.00 cm/s  SHUNTS MV E/A ratio:  0.62        Systemic VTI:  0.25 m                            Systemic Diam: 2.00 cm Alwyn Pea MD Electronically signed by Alwyn Pea MD Signature Date/Time: 05/30/2021/3:05:20 PM    Final    CT HEAD CODE STROKE WO CONTRAST  Result Date: 05/29/2021 CLINICAL DATA:  Code stroke. Neuro deficit, acute, stroke suspected. Additional provided: Sudden onset dizziness and numbness on left side. EXAM: CT HEAD WITHOUT CONTRAST TECHNIQUE: Contiguous axial images were obtained from the base of the skull through the vertex without intravenous contrast. COMPARISON:  No pertinent prior exams available for comparison. FINDINGS: Brain: Mild generalized cerebral atrophy. Mild patchy and ill-defined hypoattenuation within the cerebral white matter, nonspecific but compatible chronic small vessel ischemic disease. There is no acute intracranial hemorrhage. No demarcated cortical infarct. No extra-axial fluid collection. No evidence of an intracranial mass. No midline shift. Vascular: No hyperdense vessel.  Atherosclerotic calcifications Skull: Normal. Negative for fracture or focal lesion. Sinuses/Orbits: Visualized orbits show no acute finding. Mild mucosal thickening within the partially imaged bilateral maxillary sinuses. Chronic medially displaced fracture deformity of the left lamina papyracea. ASPECTS (Alberta Stroke Program Early CT Score) - Ganglionic level infarction (caudate, lentiform nuclei, internal capsule, insula, M1-M3 cortex): 7 - Supraganglionic infarction (M4-M6 cortex): 3 Total score (0-10 with 10 being normal): 10 These results were called by telephone at the time of interpretation on 05/29/2021 at 6:46 pm to provider Dorothea Glassman , who  verbally acknowledged these results. IMPRESSION: No evidence of acute intracranial abnormality. ASPECTS is 10. Mild generalized cerebral atrophy and cerebral white matter chronic small vessel ischemic disease. Paranasal sinus disease at the imaged levels, as described. Electronically Signed   By: Jackey Loge DO   On: 05/29/2021 18:47   CT ANGIO HEAD NECK W WO CM (CODE STROKE)  Result Date: 05/29/2021 CLINICAL DATA:  Stroke, follow-up. Sudden onset dizziness and numbness on left side. EXAM: CT ANGIOGRAPHY HEAD AND NECK TECHNIQUE: Multidetector CT imaging of the head and neck was performed using the standard protocol during bolus administration of intravenous contrast. Multiplanar CT image reconstructions and MIPs were obtained to evaluate the vascular anatomy. Carotid stenosis measurements (when applicable) are  obtained utilizing NASCET criteria, using the distal internal carotid diameter as the denominator. CONTRAST:  75mL OMNIPAQUE IOHEXOL 350 MG/ML SOLN COMPARISON:  Noncontrast head CT performed earlier today 05/29/2021. FINDINGS: CTA NECK FINDINGS Aortic arch: Common or of the innominate and left common carotid arteries. The visualized aortic arch is normal in caliber. No hemodynamically significant innominate or proximal subclavian artery stenosis. Right carotid system: CCA and ICA patent within the neck without stenosis or significant atherosclerotic disease. Left carotid system: CCA and ICA patent within the neck without stenosis or significant atherosclerotic disease. Vertebral arteries: The left vertebral artery is developmentally diminutive, but patent. The dominant right vertebral artery is patent. At least moderate atherosclerotic stenosis at the origin of the right vertebral artery. Skeleton: Cervical spondylosis. No acute bony abnormality or aggressive osseous lesion. Other neck: No neck mass or cervical lymphadenopathy. Upper chest: No consolidation within the imaged lung apices. Review of the MIP  images confirms the above findings CTA HEAD FINDINGS Anterior circulation: The intracranial internal carotid arteries are patent. Calcified plaque within both vessels with no more than mild stenosis. The M1 middle cerebral arteries are patent. No M2 proximal branch occlusion or high-grade proximal stenosis. Atherosclerotic irregularity of the M2 and more distal middle cerebral artery branches bilaterally. Most notably, there is an apparent severe stenosis within a mid to distal right M2 MCA branch (series 11, image 11). The anterior cerebral arteries are patent. Severe stenosis within the proximal A3 segment of the right anterior cerebral artery. No intracranial aneurysm is identified. Posterior circulation: The intracranial vertebral arteries are patent. The basilar artery is patent. Hypoplastic P1 segments bilaterally with sizable bilateral posterior communicating arteries. The posterior cerebral is are patent bilaterally. Atherosclerotic irregularity within both vessels, without high-grade proximal stenosis. Venous sinuses: Within the limitations of contrast timing, no convincing thrombus. Anatomic variants: As described Review of the MIP images confirms the above findings These results were called by telephone at the time of interpretation on 05/29/2021 at 7:39 pm to provider [facility] , who verbally acknowledged these results. IMPRESSION: CTA neck: 1. The common carotid and internal carotid arteries are patent within the neck without stenosis or significant atherosclerotic disease. 2. At least moderate atherosclerotic narrowing at the origin of the dominant right vertebral artery. The left vertebral artery is developmentally diminutive, but patent throughout the neck. CTA head: 1. Intracranial atherosclerotic disease with multifocal stenoses, most notably as follows. 2. Severe stenosis within the right anterior cerebral artery proximal A3 segment. 3. Severe focal stenosis within a mid to distal M2 right MCA  vessel. 4. Atherosclerotic irregularity of the bilateral posterior cerebral arteries without high-grade proximal stenosis. Electronically Signed   By: Jackey Loge DO   On: 05/29/2021 19:39    Microbiology: Recent Results (from the past 240 hour(s))  Resp Panel by RT-PCR (Flu A&B, Covid) Nasopharyngeal Swab     Status: None   Collection Time: 05/29/21  7:18 PM   Specimen: Nasopharyngeal Swab; Nasopharyngeal(NP) swabs in vial transport medium  Result Value Ref Range Status   SARS Coronavirus 2 by RT PCR NEGATIVE NEGATIVE Final    Comment: (NOTE) SARS-CoV-2 target nucleic acids are NOT DETECTED.  The SARS-CoV-2 RNA is generally detectable in upper respiratory specimens during the acute phase of infection. The lowest concentration of SARS-CoV-2 viral copies this assay can detect is 138 copies/mL. A negative result does not preclude SARS-Cov-2 infection and should not be used as the sole basis for treatment or other patient management decisions. A negative result may occur  with  improper specimen collection/handling, submission of specimen other than nasopharyngeal swab, presence of viral mutation(s) within the areas targeted by this assay, and inadequate number of viral copies(<138 copies/mL). A negative result must be combined with clinical observations, patient history, and epidemiological information. The expected result is Negative.  Fact Sheet for Patients:  BloggerCourse.com  Fact Sheet for Healthcare Providers:  SeriousBroker.it  This test is no t yet approved or cleared by the [place] FDA and  has been authorized for detection and/or diagnosis of SARS-CoV-2 by FDA under an Emergency Use Authorization (EUA). This EUA will remain  in effect (meaning this test can be used) for the duration of the COVID-19 declaration under Section 564(b)(1) of the Act, 21 U.S.C.section 360bbb-3(b)(1), unless the authorization is terminated   or revoked sooner.       Influenza A by PCR NEGATIVE NEGATIVE Final   Influenza B by PCR NEGATIVE NEGATIVE Final    Comment: (NOTE) The Xpert Xpress SARS-CoV-2/FLU/RSV plus assay is intended as an aid in the diagnosis of influenza from Nasopharyngeal swab specimens and should not be used as a sole basis for treatment. Nasal washings and aspirates are unacceptable for Xpert Xpress SARS-CoV-2/FLU/RSV testing.  Fact Sheet for Patients: BloggerCourse.com  Fact Sheet for Healthcare Providers: SeriousBroker.it  This test is not yet approved or cleared by the [place] FDA and has been authorized for detection and/or diagnosis of SARS-CoV-2 by FDA under an Emergency Use Authorization (EUA). This EUA will remain in effect (meaning this test can be used) for the duration of the COVID-19 declaration under Section 564(b)(1) of the Act, 21 U.S.C. section 360bbb-3(b)(1), unless the authorization is terminated or revoked.  Performed at [facility], [address] [place], [place] [address]      Labs: CBC: Recent Labs  Lab 05/29/21 1838  WBC 13.4*  NEUTROABS 9.9*  HGB 12.7*  HCT 42.9  MCV 68.9*  PLT 304   Basic Metabolic Panel: Recent Labs  Lab 05/29/21 1838 05/30/21 1159  NA 141 140  K 3.1* 4.0  CL 107 109  CO2 22 21*  GLUCOSE 122* 108*  BUN 30* 24*  CREATININE 2.20* 1.61*  CALCIUM 9.1 9.0  MG 2.0  --    Liver Function Tests: Recent Labs  Lab 05/29/21 1838  AST 27  ALT 26  ALKPHOS 61  BILITOT 0.6  PROT 7.8  ALBUMIN 3.8   CBG: Recent Labs  Lab 05/29/21 1826 05/30/21 0918 05/30/21 1223  GLUCAP 119* 142* 106*    Time spent: 35 minutes  Signed:  Lynden Oxford  [facility] 05/30/2021

## 2021-08-29 IMAGING — MR MR HEAD W/O CM
12 series · 46 of 48 positions shown · non-contrast
Comparison: Prior CT from earlier the same day.

CLINICAL DATA: Initial evaluation for acute dizziness, weakness.

EXAM:
MRI HEAD WITHOUT CONTRAST
TECHNIQUE: Multiplanar, multiecho pulse sequences of the brain and surrounding
structures were obtained without intravenous contrast.

[Series 5: ax dwi_tracew · axial · 3.0mm · 0.65mm/px · z∈[-79,+73]mm · 4 of 48 slices shown]
[im 1/48]
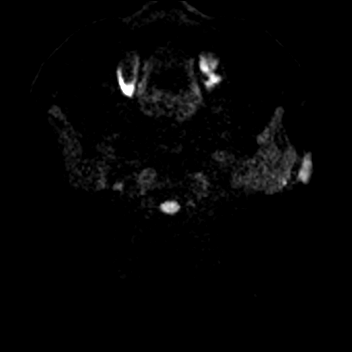
[im 16/48]
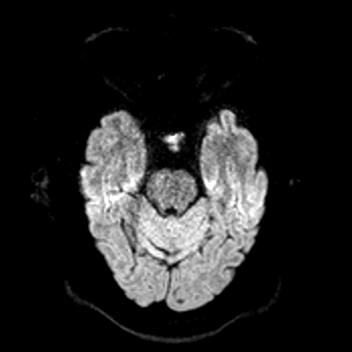
[im 32/48]
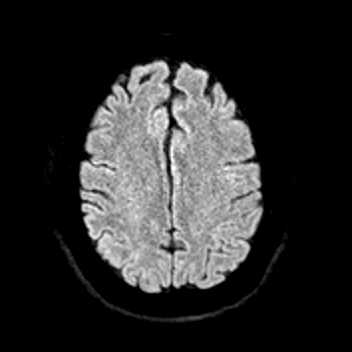
[im 48/48]
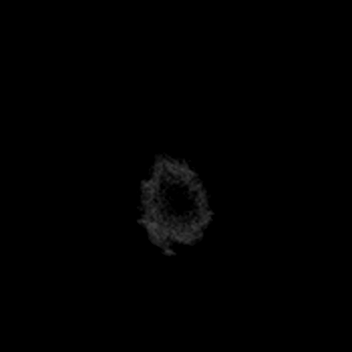

[Series 6: ax dwi_adc · axial · 3.0mm · 0.65mm/px · z∈[-79,+73]mm · 3 of 48 slices shown]
[im 1/48]
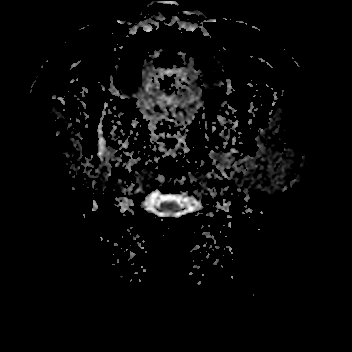
[im 24/48]
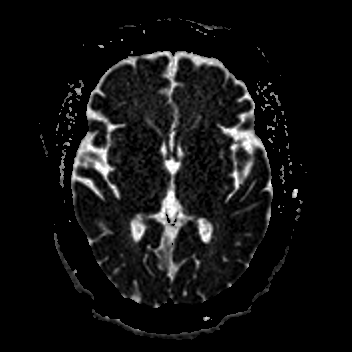
[im 48/48]
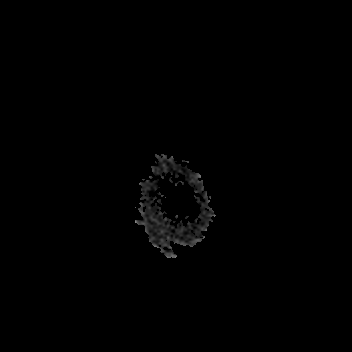

[Series 7: cor dwi_tracew · coronal · 5.0mm · 0.65mm/px · 3 of 40 slices shown]
[im 1/40]
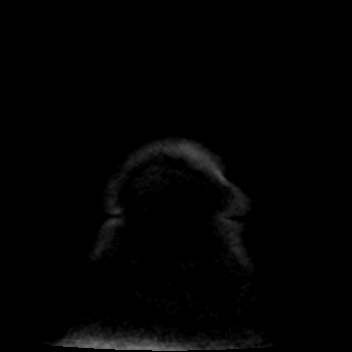
[im 20/40]
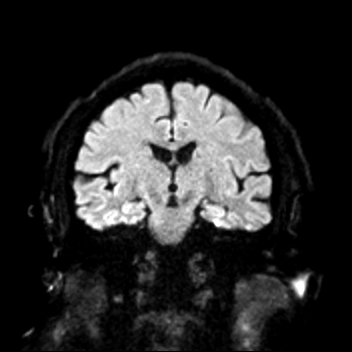
[im 40/40]
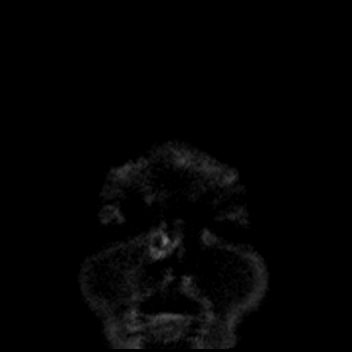

[Series 8: cor dwi_adc · coronal · 5.0mm · 0.65mm/px · 3 of 40 slices shown]
[im 1/40]
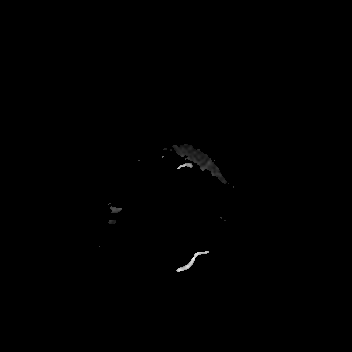
[im 20/40]
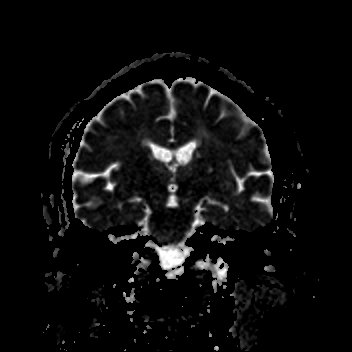
[im 40/40]
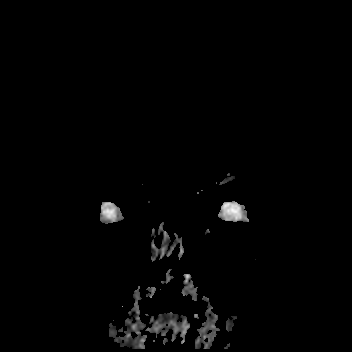

[Series 9: T1 · sagittal · 5.0mm · 0.62mm/px · 2 of 27 slices shown (1 of 2)]
[im 1/27]
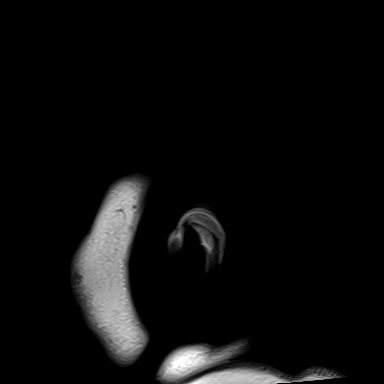
[im 27/27]
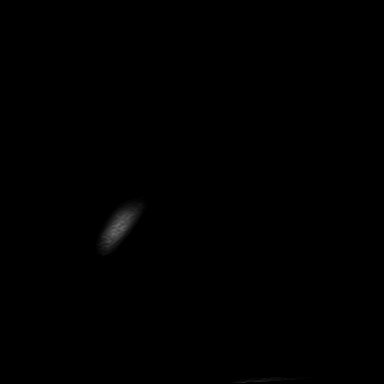

[Series 10: T2 · axial · 5.0mm · 0.53mm/px · z∈[-80,+73]mm · 2 of 27 slices shown (1 of 2)]
[im 1/27]
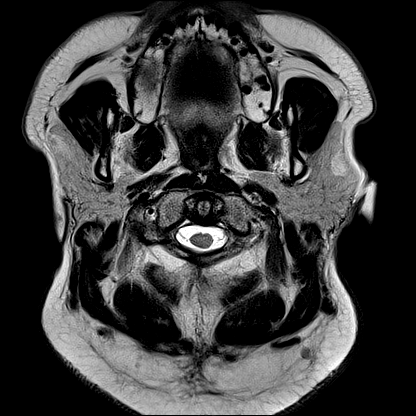
[im 27/27]
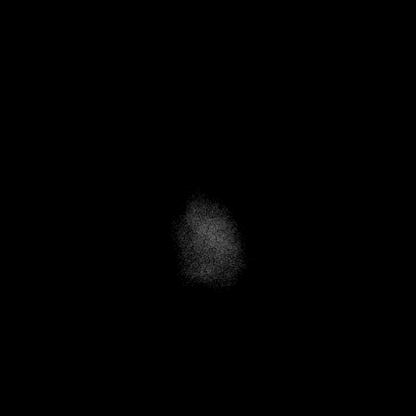

[Series 11: mag_images · axial · 3.0mm · 0.90mm/px · z∈[-89,+84]mm · 4 of 60 slices shown]
[im 1/60]
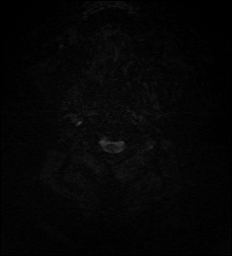
[im 20/60]
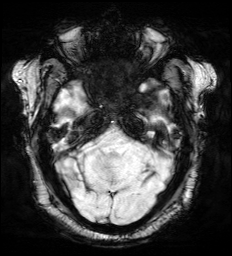
[im 40/60]
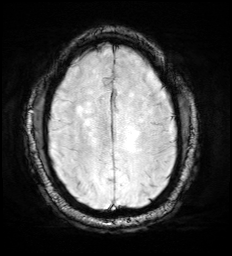
[im 60/60]
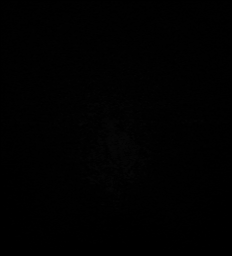

[Series 12: pha_images · axial · 3.0mm · 0.90mm/px · z∈[-83,+81]mm · 4 of 57 slices shown]
[im 1/57]
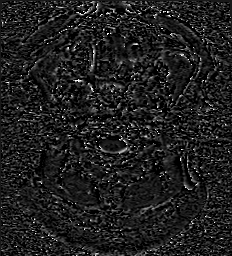
[im 19/57]
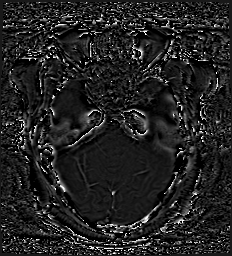
[im 38/57]
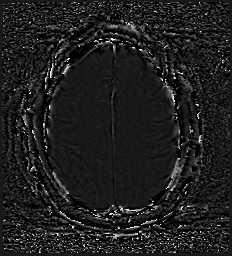
[im 57/57]
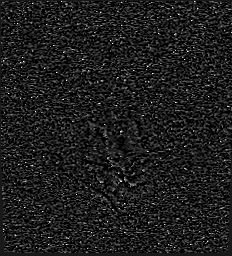

[Series 13: swi_images · axial · 3.0mm · 0.90mm/px · z∈[-89,+84]mm · 4 of 60 slices shown]
[im 1/60]
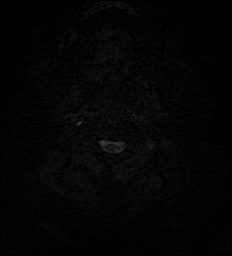
[im 20/60]
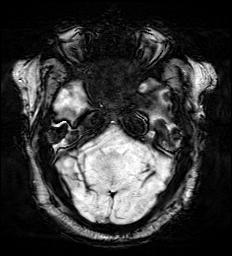
[im 40/60]
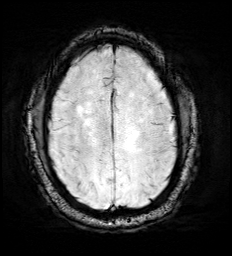
[im 60/60]
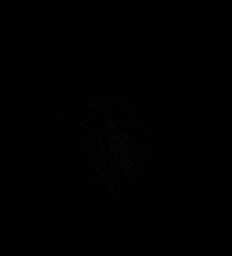

[Series 15: FLAIR · axial · 3.0mm · 0.53mm/px · z∈[-83,+76]mm · 4 of 55 slices shown]
[im 1/55]
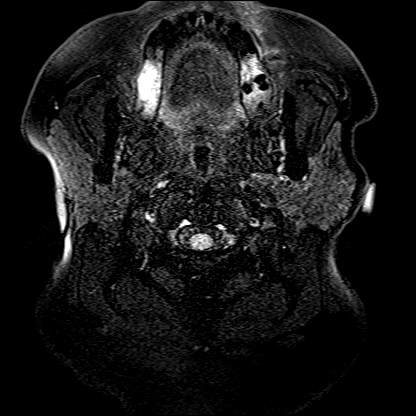
[im 19/55]
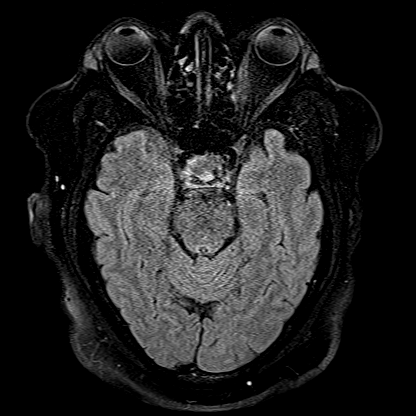
[im 37/55]
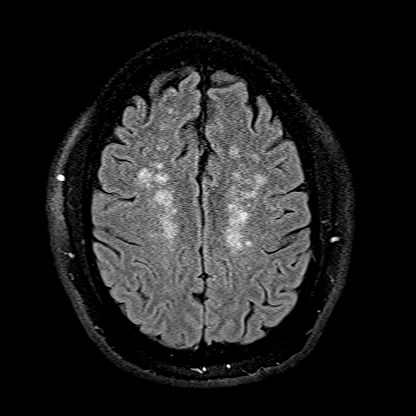
[im 55/55]
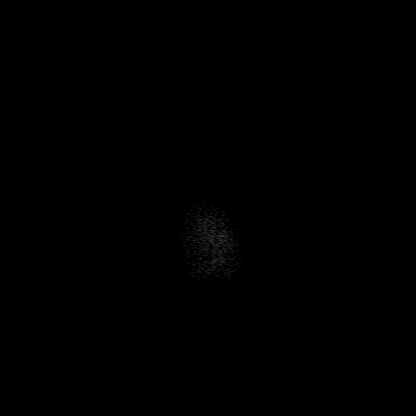

[Series 16: T1 · axial · 1.0mm · 0.98mm/px · z∈[-86,+85]mm · 11 of 176 slices shown (2 of 2)]
[im 1/176]
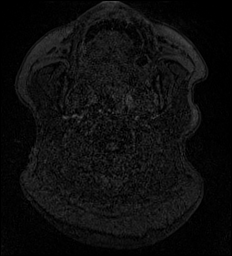
[im 15/176]
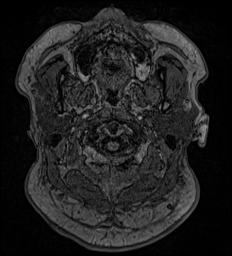
[im 30/176]
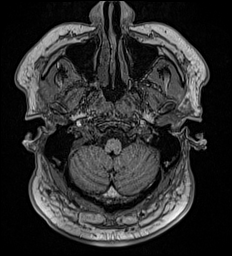
[im 44/176]
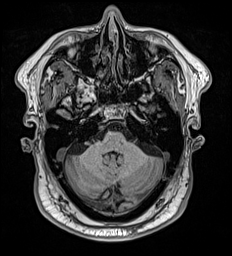
[im 59/176]
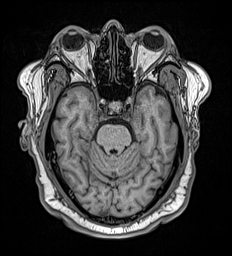
[im 73/176]
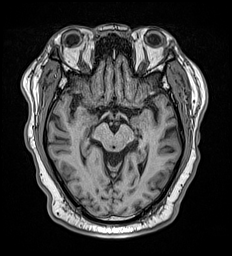
[im 88/176]
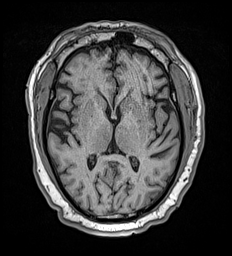
[im 103/176]
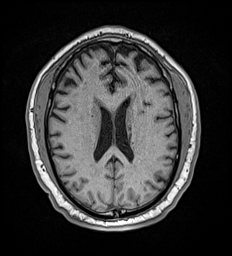
[im 117/176]
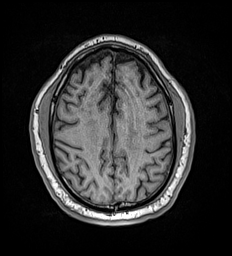
[im 146/176]
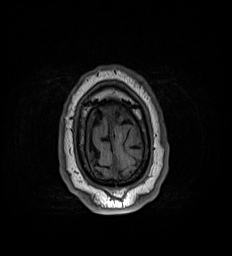
[im 176/176]
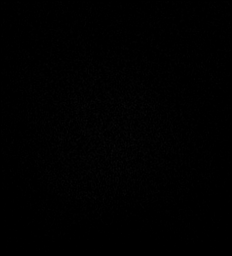

[Series 17: T2 · coronal · 5.0mm · 0.57mm/px · 2 of 31 slices shown (2 of 2)]
[im 1/31]
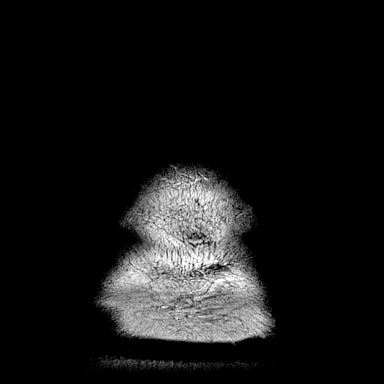
[im 31/31]
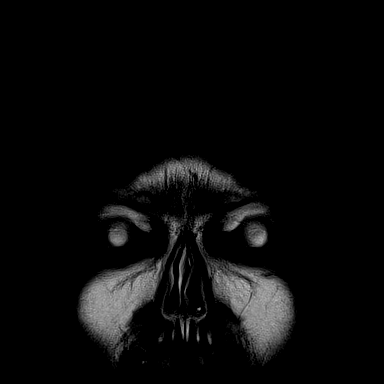

[46 of 48 positions shown; findings below may reference images not displayed]

FINDINGS: Brain: Cerebral volume within normal limits for age. Scattered
patchy T2/FLAIR hyperintensity involving the periventricular and
deep white matter both cerebral hemispheres most consistent with
chronic small vessel ischemic disease, moderate in nature. Few
scatter remote lacunar infarcts present about the bilateral basal
ganglia and thalami.

No abnormal foci of restricted diffusion to suggest acute or
subacute ischemia. Gray-white matter differentiation maintained. No
encephalomalacia to suggest chronic cortical infarction. No evidence
for acute or chronic intracranial hemorrhage.

No mass lesion, midline shift or mass effect. No hydrocephalus or
extra-axial fluid collection. Pituitary gland suprasellar region
normal. Midline structures intact.

Vascular: Major intracranial vascular flow voids are maintained.

Skull and upper cervical spine: Craniocervical junction within
normal limits. Upper cervical spine normal. Bone marrow signal
intensity within normal limits. No acute scalp soft tissue
abnormality.

Sinuses/Orbits: Globes and orbital soft tissues demonstrate no acute
finding. Scattered mucosal thickening noted within the ethmoidal air
cells and maxillary sinuses. No mastoid effusion. Inner ear
structures grossly normal.

Other: None.
IMPRESSION: 1. No acute intracranial infarct or other abnormality.
2. Underlying moderate chronic microvascular ischemic disease.

## 2022-06-14 NOTE — Progress Notes (Signed)
Patient was no-show for appointment.  The office staff will contact the patient for rescheduling follow-up. 

## 2022-06-15 ENCOUNTER — Ambulatory Visit: Payer: Medicaid Other | Admitting: Internal Medicine

## 2022-08-17 ENCOUNTER — Ambulatory Visit (INDEPENDENT_AMBULATORY_CARE_PROVIDER_SITE_OTHER): Payer: Medicaid Other | Admitting: Internal Medicine

## 2022-08-17 VITALS — BP 159/85 | HR 90 | Resp 18 | Ht 73.0 in | Wt 332.0 lb

## 2022-08-17 DIAGNOSIS — I10 Essential (primary) hypertension: Secondary | ICD-10-CM

## 2022-08-17 DIAGNOSIS — G4733 Obstructive sleep apnea (adult) (pediatric): Secondary | ICD-10-CM

## 2022-08-17 NOTE — Progress Notes (Signed)
Sleep Medicine   Office Visit  Patient Name: Jack Nichols DOB: 1961/02/20 MRN [id number]    Chief Complaint: sleep evaluation  Brief History:  Jack Nichols presents for an initial evaluation for possible sleep apnea.  Sleep quality is good. This is noted most nights. The patient's bed partner reports  loud snoring at night. The patient relates the following symptoms: loud snoring and excessive daytime sleepiness are also present. The patient goes to sleep at 12am and wakes up at 8am.   Sleep quality is same when outside home environment.  Patient has noted no restlessness of his legs at night.  The patient  relates no unusual behavior during the night.  The patient denies a history of psychiatric problems. The Epworth Sleepiness Score is 10 out of 24 .  The patient relates  Cardiovascular risk factors include: labile hypertension.     ROS  General: (-) fever, (-) chills, (-) night sweat Nose and Sinuses: (-) nasal stuffiness or itchiness, (-) postnasal drip, (-) nosebleeds, (-) sinus trouble. Mouth and Throat: (-) sore throat, (-) hoarseness. Neck: (-) swollen glands, (-) enlarged thyroid, (-) neck pain. Respiratory: - cough, - shortness of breath, - wheezing. Neurologic: - numbness, - tingling. Psychiatric: - anxiety, - depression Sleep behavior: -sleep paralysis -hypnogogic hallucinations -dream enactment      -vivid dreams -cataplexy -night terrors -sleep walking   Current Medication: Outpatient Encounter Medications as of 08/17/2022  Medication Sig   gabapentin (NEURONTIN) 300 MG capsule Take by mouth.   oxyCODONE (OXYCONTIN) 10 mg 12 hr tablet Take by mouth.   atorvastatin (LIPITOR) 80 MG tablet Take 80 mg by mouth daily.   cyclobenzaprine (FLEXERIL) 10 MG tablet Take 1 tablet (10 mg total) by mouth every 8 (eight) hours as needed for muscle spasms. (Patient not taking: Reported on 05/30/2021)   gabapentin (NEURONTIN) 300 MG capsule Take 300 mg by mouth 3 (three) times daily.    lidocaine (LIDODERM) 5 % Place 1 patch onto the skin daily. Remove & Discard patch within 12 hours or as directed by MD (Patient not taking: Reported on 05/30/2021)   lisinopril (ZESTRIL) 20 MG tablet Take 20 mg by mouth daily.   metFORMIN (GLUCOPHAGE) 500 MG tablet Take 500 mg by mouth daily.   metoprolol tartrate (LOPRESSOR) 25 MG tablet Take 1 tablet (25 mg total) by mouth 2 (two) times daily.   OXYCONTIN 10 MG 12 hr tablet Take 10 mg by mouth every 8 (eight) hours.   [DISCONTINUED] aspirin EC 81 MG EC tablet Take 1 tablet (81 mg total) by mouth daily. Swallow whole.   [DISCONTINUED] clopidogrel (PLAVIX) 75 MG tablet Take 1 tablet (75 mg total) by mouth daily.   [DISCONTINUED] diazepam (VALIUM) 5 MG tablet Take 1 tablet (5 mg total) by mouth every 8 (eight) hours as needed for anxiety. (Patient not taking: Reported on 05/30/2021)   No facility-administered encounter medications on file as of 08/17/2022.    Surgical History: Past Surgical History:  Procedure Laterality Date   BACK SURGERY      Medical History: Past Medical History:  Diagnosis Date   Diabetes mellitus without complication ([place])    Hypertension     Family History: Non contributory to the present illness  Social History: Social History   Socioeconomic History   Marital status: Single    Spouse name: Not on file   Number of children: Not on file   Years of education: Not on file   Highest education level: Not on file  Occupational  History   Not on file  Tobacco Use   Smoking status: Never   Smokeless tobacco: Never  Substance and Sexual Activity   Alcohol use: No   Drug use: Not on file   Sexual activity: Not on file  Other Topics Concern   Not on file  Social History Narrative   Not on file   Social Determinants of Health   Financial Resource Strain: Not on file  Food Insecurity: Not on file  Transportation Needs: Not on file  Physical Activity: Not on file  Stress: Not on file  Social  Connections: Not on file  Intimate Partner Violence: Not on file    Vital Signs: Blood pressure (!) 159/85, pulse 90, resp. rate 18, height 6\' 1"  (1.854 m), weight (!) 332 lb (150.6 kg), SpO2 96 %. Body mass index is 43.8 kg/m.   Examination: General Appearance: The patient is well-developed, well-nourished, and in no distress. Neck Circumference: 49 cm Skin: Gross inspection of skin unremarkable. Head: normocephalic, no gross deformities. Eyes: no gross deformities noted. ENT: ears appear grossly normal Neurologic: Alert and oriented. No involuntary movements.    STOP BANG RISK ASSESSMENT S (snore) Have you been told that you snore?     YES   T (tired) Are you often tired, fatigued, or sleepy during the day?   YES  O (obstruction) Do you stop breathing, choke, or gasp during sleep? NO   P (pressure) Do you have or are you being treated for high blood pressure? YES   B (BMI) Is your body index greater than 35 kg/m? YES   A (age) Are you 14 years old or older? YES   N (neck) Do you have a neck circumference greater than 16 inches?   YES   G (gender) Are you a male? YES   TOTAL STOP/BANG "YES" ANSWERS 7                                                               A STOP-Bang score of 2 or less is considered low risk, and a score of 5 or more is high risk for having either moderate or severe OSA. For people who score 3 or 4, doctors may need to perform further assessment to determine how likely they are to have OSA.         EPWORTH SLEEPINESS SCALE:  Scale:  (0)= no chance of dozing; (1)= slight chance of dozing; (2)= moderate chance of dozing; (3)= high chance of dozing  Chance  Situtation    Sitting and reading: 2    Watching TV: 2    Sitting Inactive in public: 0    As a passenger in car: 1      Lying down to rest: 3    Sitting and talking: 0    Sitting quielty after lunch: 2    In a car, stopped in traffic: 0   TOTAL SCORE:   10 out of  24    SLEEP STUDIES:  None on file   LABS: No results found for this or any previous visit (from the past 2160 hour(s)).  Radiology: ECHOCARDIOGRAM COMPLETE  Result Date: 05/30/2021    ECHOCARDIOGRAM REPORT   Patient Name:   Jack Nichols Date of Exam: 05/30/2021 Medical Rec #:  [id number]           Height:       74.0 in Accession #:    [id number]          Weight:       340.0 lb Date of Birth:  11/04/1961            BSA:          2.723 m Patient Age:    71 years            BP:           181/102 mmHg Patient Gender: M                   HR:           85 bpm. Exam Location:  [facility] Procedure: 2D Echo, Color Doppler and Cardiac Doppler Indications:     Stroke I63.9  History:         Patient has no prior history of Echocardiogram examinations.                  Risk Factors:Hypertension and Diabetes.  Sonographer:     Sherrie Sport RDCS (AE) Referring Phys:  [id number] [place] Diagnosing Phys: Jack Nichols Kida MD  Sonographer Comments: Technically challenging study due to limited acoustic windows and suboptimal apical window. IMPRESSIONS  1. Left ventricular ejection fraction, by estimation, is 70 to 75%. The left ventricle has hyperdynamic function. The left ventricle has no regional wall motion abnormalities. There is mild concentric left ventricular hypertrophy. Left ventricular diastolic parameters are consistent with Grade III diastolic dysfunction (restrictive).  2. Right ventricular systolic function is normal. The right ventricular size is normal.  3. The mitral valve is grossly normal. Trivial mitral valve regurgitation.  4. The aortic valve is normal in structure. Aortic valve regurgitation is not visualized. FINDINGS  Left Ventricle: Left ventricular ejection fraction, by estimation, is 70 to 75%. The left ventricle has hyperdynamic function. The left ventricle has no regional wall motion abnormalities. The left ventricular internal cavity size was normal in size. There is mild concentric left  ventricular hypertrophy. Left ventricular diastolic parameters are consistent with Grade III diastolic dysfunction (restrictive). Right Ventricle: The right ventricular size is normal. No increase in right ventricular wall thickness. Right ventricular systolic function is normal. Left Atrium: Left atrial size was normal in size. Right Atrium: Right atrial size was normal in size. Pericardium: There is no evidence of pericardial effusion. Mitral Valve: The mitral valve is grossly normal. There is mild thickening of the mitral valve leaflet(s). Mild mitral annular calcification. Trivial mitral valve regurgitation. Tricuspid Valve: The tricuspid valve is normal in structure. Tricuspid valve regurgitation is mild. Aortic Valve: The aortic valve is normal in structure. Aortic valve regurgitation is not visualized. Aortic valve mean gradient measures 3.0 mmHg. Aortic valve peak gradient measures 6.1 mmHg. Aortic valve area, by VTI measures 3.66 cm. Pulmonic Valve: The pulmonic valve was normal in structure. Pulmonic valve regurgitation is not visualized. Aorta: The ascending aorta was not well visualized. IAS/Shunts: No atrial level shunt detected by color flow Doppler.  LEFT VENTRICLE PLAX 2D LVIDd:         4.13 cm  Diastology LVIDs:         2.42 cm  LV e' medial:    8.05 cm/s LV PW:         1.32 cm  LV E/e' medial:  6.7 LV IVS:  1.14 cm  LV e' lateral:   15.70 cm/s LVOT diam:     2.00 cm  LV E/e' lateral: 3.4 LV SV:         77 LV SV Index:   28 LVOT Area:     3.14 cm  RIGHT VENTRICLE RV Basal diam:  3.86 cm RV S prime:     16.40 cm/s TAPSE (M-mode): 4.6 cm LEFT ATRIUM           Index       RIGHT ATRIUM           Index LA diam:      3.50 cm 1.29 cm/m  RA Area:     18.90 cm LA Vol (A4C): 67.7 ml 24.86 ml/m RA Volume:   52.50 ml  19.28 ml/m  AORTIC VALVE                   PULMONIC VALVE AV Area (Vmax):    3.09 cm    PV Vmax:        0.84 m/s AV Area (Vmean):   3.26 cm    PV Peak grad:   2.8 mmHg AV Area (VTI):      3.66 cm    RVOT Peak grad: 2 mmHg AV Vmax:           123.00 cm/s AV Vmean:          84.200 cm/s AV VTI:            0.211 m AV Peak Grad:      6.1 mmHg AV Mean Grad:      3.0 mmHg LVOT Vmax:         121.00 cm/s LVOT Vmean:        87.500 cm/s LVOT VTI:          0.246 m LVOT/AV VTI ratio: 1.17  AORTA Ao Root diam: 3.40 cm MITRAL VALVE               TRICUSPID VALVE MV Area (PHT): 4.21 cm    TR Peak grad:   7.4 mmHg MV Decel Time: 180 msec    TR Vmax:        136.00 cm/s MV E velocity: 53.60 cm/s MV A velocity: 87.00 cm/s  SHUNTS MV E/A ratio:  0.62        Systemic VTI:  0.25 m                            Systemic Diam: 2.00 cm Jack Nichols Kida MD Electronically signed by Jack Nichols Kida MD Signature Date/Time: 05/30/2021/3:05:20 PM    Final     No results found.  No results found.    Assessment and Plan: Patient Active Problem List   Diagnosis Date Noted   Morbid obesity ([place]) 08/17/2022   OSA (obstructive sleep apnea) 08/17/2022   Acute CVA (cerebrovascular accident) ([place]) 05/29/2021   Diabetes ([place]) 05/29/2021   HTN (hypertension) 05/29/2021   Chronic pain syndrome 04/05/2013   Postlaminectomy syndrome, lumbar region 04/05/2013   1. OSA (obstructive sleep apnea) PLAN OSA:   Patient evaluation suggests high risk of sleep disordered breathing due to snoring, daytime sleepiness, morbid obesity and frequent awakenings.  Patient has comorbid cardiovascular risk factors including: labile hypertension which could be exacerbated by pathologic sleep-disordered breathing.  Suggest: PSG  to assess  the patient's sleep disordered breathing. The patient was also counselled on weight loss to optimize sleep health.  2. Hypertension, unspecified type Hypertension Counseling:   The following hypertensive lifestyle modification were recommended and discussed:  1. Limiting alcohol intake to less than 1 oz/day of ethanol:(24 oz of beer or 8 oz of wine or 2 oz of 100-proof whiskey). 2. Take baby ASA  81 mg daily. 3. Importance of regular aerobic exercise and losing weight. 4. Reduce dietary saturated fat and cholesterol intake for overall cardiovascular health. 5. Maintaining adequate dietary potassium, calcium, and magnesium intake. 6. Regular monitoring of the blood pressure. 7. Reduce sodium intake to less than 100 mmol/day (less than 2.3 gm of sodium or less than 6 gm of sodium choride)    3. Morbid obesity ([place]) Obesity Counseling: Had a lengthy discussion regarding patients BMI and weight issues. Patient was instructed on portion control as well as increased activity. Also discussed caloric restrictions with trying to maintain intake less than 2000 Kcal. Discussions were made in accordance with the 5As of weight management. Simple actions such as not eating late and if able to, taking a walk is suggested.    General Counseling: I have discussed the findings of the evaluation and examination with [facility].  I have also discussed any further diagnostic evaluation thatmay be needed or ordered today. Jack Nichols verbalizes understanding of the findings of todays visit. We also reviewed his medications today and discussed drug interactions and side effects including but not limited excessive drowsiness and altered mental states. We also discussed that there is always a risk not just to him but also people around him. he has been encouraged to call the office with any questions or concerns that should arise related to todays visit.  No orders of the defined types were placed in this encounter.       I have personally obtained a history, evaluated the patient, evaluated pertinent data, formulated the assessment and plan and placed orders.   This patient was seen today by Tressie Ellis, PA-C in collaboration with Dr. Devona Konig.   Allyne Gee, MD [facility] Diplomate ABMS Pulmonary and Critical Care Medicine Sleep medicine

## 2023-04-19 ENCOUNTER — Encounter: Payer: Self-pay | Admitting: Intensive Care

## 2023-04-19 ENCOUNTER — Emergency Department
Admission: EM | Admit: 2023-04-19 | Discharge: 2023-04-19 | Disposition: A | Discharge status: Home / Self Care | Payer: Medicaid Other | Attending: Emergency Medicine | Admitting: Emergency Medicine

## 2023-04-19 ENCOUNTER — Other Ambulatory Visit: Payer: Self-pay

## 2023-04-19 ENCOUNTER — Emergency Department: Payer: Medicaid Other

## 2023-04-19 DIAGNOSIS — R600 Localized edema: Secondary | ICD-10-CM | POA: Diagnosis not present

## 2023-04-19 DIAGNOSIS — E119 Type 2 diabetes mellitus without complications: Secondary | ICD-10-CM | POA: Insufficient documentation

## 2023-04-19 DIAGNOSIS — I10 Essential (primary) hypertension: Secondary | ICD-10-CM | POA: Insufficient documentation

## 2023-04-19 DIAGNOSIS — M549 Dorsalgia, unspecified: Secondary | ICD-10-CM | POA: Diagnosis present

## 2023-04-19 DIAGNOSIS — M7989 Other specified soft tissue disorders: Secondary | ICD-10-CM | POA: Diagnosis not present

## 2023-04-19 DIAGNOSIS — G8929 Other chronic pain: Secondary | ICD-10-CM

## 2023-04-19 LAB — CBC WITH DIFFERENTIAL/PLATELET
Abs Immature Granulocytes: 0.02 10*3/uL (ref 0.00–0.07)
Basophils Absolute: 0 10*3/uL (ref 0.0–0.1)
Basophils Relative: 0 %
Eosinophils Absolute: 0.1 10*3/uL (ref 0.0–0.5)
Eosinophils Relative: 1 %
HCT: 45 % (ref 39.0–52.0)
Hemoglobin: 13.3 g/dL (ref 13.0–17.0)
Immature Granulocytes: 0 %
Lymphocytes Relative: 22 %
Lymphs Abs: 1.7 10*3/uL (ref 0.7–4.0)
MCH: 21.9 pg — ABNORMAL LOW (ref 26.0–34.0)
MCHC: 29.6 g/dL — ABNORMAL LOW (ref 30.0–36.0)
MCV: 74.1 fL — ABNORMAL LOW (ref 80.0–100.0)
Monocytes Absolute: 0.6 10*3/uL (ref 0.1–1.0)
Monocytes Relative: 8 %
Neutro Abs: 5.5 10*3/uL (ref 1.7–7.7)
Neutrophils Relative %: 69 %
Platelets: 214 10*3/uL (ref 150–400)
RBC: 6.07 MIL/uL — ABNORMAL HIGH (ref 4.22–5.81)
RDW: 17.7 % — ABNORMAL HIGH (ref 11.5–15.5)
WBC: 7.9 10*3/uL (ref 4.0–10.5)
nRBC: 0 % (ref 0.0–0.2)

## 2023-04-19 LAB — COMPREHENSIVE METABOLIC PANEL
ALT: 17 U/L (ref 0–44)
AST: 27 U/L (ref 15–41)
Albumin: 3.4 g/dL — ABNORMAL LOW (ref 3.5–5.0)
Alkaline Phosphatase: 91 U/L (ref 38–126)
Anion gap: 8 (ref 5–15)
BUN: 15 mg/dL (ref 6–20)
CO2: 23 mmol/L (ref 22–32)
Calcium: 8.6 mg/dL — ABNORMAL LOW (ref 8.9–10.3)
Chloride: 111 mmol/L (ref 98–111)
Creatinine, Ser: 1.24 mg/dL (ref 0.61–1.24)
GFR, Estimated: >60 mL/min (ref >60)
Glucose, Bld: 167 mg/dL — ABNORMAL HIGH (ref 70–99)
Potassium: 3.7 mmol/L (ref 3.5–5.1)
Sodium: 142 mmol/L (ref 135–145)
Total Bilirubin: 0.5 mg/dL (ref 0.3–1.2)
Total Protein: 6.9 g/dL (ref 6.5–8.1)

## 2023-04-19 MED ORDER — OXYCODONE HCL ER 10 MG PO T12A: 10.0000 mg | EXTENDED_RELEASE_TABLET | Freq: Two times a day (BID) | ORAL | 0 refills | Status: AC

## 2023-04-19 MED ORDER — HYDROCHLOROTHIAZIDE 25 MG PO TABS: 25.0000 mg | ORAL_TABLET | Freq: Every day | ORAL | 0 refills | Status: AC

## 2023-04-19 NOTE — ED Notes (Signed)
See triage note  Presents with some lower back pain and leg swelling  Sxs' started a few weeks ago

## 2023-04-19 NOTE — ED Triage Notes (Signed)
Patient presents with lower back pain and left leg swelling. Reports the swelling has been going on a few weeks.  Reports he is also here for pain medication because he ran out and is waiting to hear from the pain clinic

## 2023-04-19 NOTE — ED Provider Notes (Signed)
[facility] Provider Note    Event Date/Time   First MD Initiated Contact with Patient 04/19/23 [phone]     (approximate)   History   Back Pain and Leg Swelling   HPI  Jack Nichols is a 62 y.o. male presents to the ED with complaint of left leg swelling for approximately 2 weeks.  Patient denies any recent injury to his leg or traveling.  He denies any past DVTs.  He also states that his low back is hurting due to running out of his pain medication.  He currently is waiting to hear back from the pain clinic about medication.  Medical history includes CVA, diabetes, hypertension, lumbar laminectomy and chronic pain syndrome.     Physical Exam   Triage Vital Signs: ED Triage Vitals  Enc Vitals Group     BP 04/19/23 0934 (!) 176/97     Pulse Rate 04/19/23 0934 91     Resp 04/19/23 0934 18     Temp 04/19/23 0934 98 F (36.7 C)     Temp Source 04/19/23 0934 Oral     SpO2 04/19/23 0934 95 %     Weight 04/19/23 0936 (!) 330 lb (149.7 kg)     Height 04/19/23 0936 6\' 2"  (1.88 m)     Head Circumference --      Peak Flow --      Pain Score 04/19/23 0936 10     Pain Loc --      Pain Edu? --      Excl. in GC? --     Most recent vital signs: Vitals:   04/19/23 0934 04/19/23 1208  BP: (!) 176/97 (!) 170/88  Pulse: 91 88  Resp: 18 18  Temp: 98 F (36.7 C)   SpO2: 95% 95%     General: Awake, no distress.  CV:  Good peripheral perfusion.  Heart regular rate and rhythm. Resp:  Normal effort.  Abd:  No distention.  Other:  Left lower extremity with nonpitting edema.  No warmth or erythema present.  Skin is intact.  Pulses present both PT and DP.  No point tenderness on palpation.     ED Results / Procedures / Treatments   Labs (all labs ordered are listed, but only abnormal results are displayed) Labs Reviewed  CBC WITH DIFFERENTIAL/PLATELET - Abnormal; Notable for the following components:      Result Value   RBC 6.07 (*)    MCV 74.1 (*)     MCH 21.9 (*)    MCHC 29.6 (*)    RDW 17.7 (*)    All other components within normal limits  COMPREHENSIVE METABOLIC PANEL - Abnormal; Notable for the following components:   Glucose, Bld 167 (*)    Calcium 8.6 (*)    Albumin 3.4 (*)    All other components within normal limits      RADIOLOGY US Venous left lower extremity is negative for DVT per radiologist.     PROCEDURES:  Critical Care performed:   Procedures   MEDICATIONS ORDERED IN ED: Medications - No data to display   IMPRESSION / MDM / ASSESSMENT AND PLAN / ED COURSE  I reviewed the triage vital signs and the nursing notes.   Differential diagnosis includes, but is not limited to, left lower extremity DVT, peripheral edema, cellulitis,  62 year old male presents to the ED with complaint of left lower extremity swelling without known history of injury.  Patient states no prior DVT.  Ultrasound  was reassuring and patient was made aware that he does not have a DVT.  Lab work was benign with the exception of his glucose nonfasting at 167.  Patient is waiting for authorization for his pain medication that he gets through the pain clinic at [facility].  He was made aware that we do not write prescriptions for continued chronic pain.  He was given a prescription for oxycodone 10 mg IR 1 every 12 hours #8.  Patient is to call the pain clinic today to make arrangements to be seen.  A prescription for hydrochlorothiazide was sent to the pharmacy to see if this will help with some of his edema and encouraged to elevate as needed.  Return to the emergency department if any severe worsening of his symptoms.      Patient's presentation is most consistent with acute presentation with potential threat to life or bodily function.  FINAL CLINICAL IMPRESSION(S) / ED DIAGNOSES   Final diagnoses:  Peripheral edema  Chronic back pain greater than 3 months duration     Rx / DC Orders   ED Discharge Orders          Ordered     hydrochlorothiazide (HYDRODIURIL) 25 MG tablet  Daily        04/19/23 1201    oxyCODONE (OXYCONTIN) 10 mg 12 hr tablet  Every 12 hours        04/19/23 1201             Note:  This document was prepared using Dragon voice recognition software and may include unintentional dictation errors.   Tommi Rumps, PA-C 04/19/23 1219    Chesley Noon, MD 04/24/23 [phone]

## 2023-04-19 NOTE — Discharge Instructions (Signed)
Call make an appointment with your primary care provider for follow-up of your leg swelling and evaluation.  2 medications were sent to the pharmacy.  1 is your pain medication which is every 12 hours and enough for 4 days.  You will need to contact your primary care or your pain clinic for continued pain medication.

## 2023-06-17 ENCOUNTER — Emergency Department
Admission: EM | Admit: 2023-06-17 | Discharge: 2023-06-17 | Disposition: A | Discharge status: Home / Self Care | Payer: Medicaid Other | Attending: Emergency Medicine | Admitting: Emergency Medicine

## 2023-06-17 ENCOUNTER — Other Ambulatory Visit: Payer: Self-pay

## 2023-06-17 DIAGNOSIS — H6691 Otitis media, unspecified, right ear: Secondary | ICD-10-CM | POA: Diagnosis not present

## 2023-06-17 DIAGNOSIS — Z79899 Other long term (current) drug therapy: Secondary | ICD-10-CM | POA: Insufficient documentation

## 2023-06-17 DIAGNOSIS — E119 Type 2 diabetes mellitus without complications: Secondary | ICD-10-CM | POA: Diagnosis not present

## 2023-06-17 DIAGNOSIS — Z7984 Long term (current) use of oral hypoglycemic drugs: Secondary | ICD-10-CM | POA: Insufficient documentation

## 2023-06-17 DIAGNOSIS — I10 Essential (primary) hypertension: Secondary | ICD-10-CM | POA: Diagnosis not present

## 2023-06-17 DIAGNOSIS — H9201 Otalgia, right ear: Secondary | ICD-10-CM

## 2023-06-17 DIAGNOSIS — H6690 Otitis media, unspecified, unspecified ear: Secondary | ICD-10-CM

## 2023-06-17 MED ORDER — AMOXICILLIN 500 MG PO CAPS: 500.0000 mg | ORAL_CAPSULE | Freq: Three times a day (TID) | ORAL | 0 refills | Status: AC

## 2023-06-17 MED ORDER — AMOXICILLIN 500 MG PO CAPS
500.0000 mg | ORAL_CAPSULE | Freq: Once | ORAL | Status: AC
  Administered 2023-06-17: 500 mg via ORAL
  Filled 2023-06-17: qty 1

## 2023-06-17 MED ORDER — LIDOCAINE HCL (PF) 1 % IJ SOLN: 2.0000 mL | Freq: Once | INTRAMUSCULAR | Status: DC

## 2023-06-17 NOTE — ED Triage Notes (Signed)
Pt presents to ER with c/o right ear pain that has been going on for a couple days.  Pt states his ear is "not hurting, just been bothering me more than anything."  Denies issues with hearing, but states that he hears some "popping" noises from his right ear.  Pt is otherwise A&O x4 and in NAD.

## 2023-06-17 NOTE — ED Provider Notes (Signed)
[facility] Provider Note    Event Date/Time   First MD Initiated Contact with Patient 06/17/23 [phone]     (approximate)   History   Otalgia   HPI  Jack Nichols is a 62 y.o. male who presents to the ED from home with a chief complaint of right ear pain x 2 days.  Denies fever/chills, discharge, nausea/vomiting or dizziness.  Hearing some popping noises emanating from his right ear.     Past Medical History   Past Medical History:  Diagnosis Date   Diabetes mellitus without complication (HCC)    High cholesterol    Hypertension      Active Problem List   Patient Active Problem List   Diagnosis Date Noted   Morbid obesity (HCC) 08/17/2022   OSA (obstructive sleep apnea) 08/17/2022   Acute CVA (cerebrovascular accident) (HCC) 05/29/2021   Diabetes (HCC) 05/29/2021   HTN (hypertension) 05/29/2021   Chronic pain syndrome 04/05/2013   Postlaminectomy syndrome, lumbar region 04/05/2013     Past Surgical History   Past Surgical History:  Procedure Laterality Date   BACK SURGERY       Home Medications   Prior to Admission medications   Medication Sig Start Date End Date Taking? Authorizing Provider  amoxicillin (AMOXIL) 500 MG capsule Take 1 capsule (500 mg total) by mouth 3 (three) times daily. 06/17/23  Yes Irean Hong, MD  atorvastatin (LIPITOR) 80 MG tablet Take 80 mg by mouth daily. 05/08/21   [provider]  gabapentin (NEURONTIN) 300 MG capsule Take 300 mg by mouth 3 (three) times daily. 05/13/21   [provider]  gabapentin (NEURONTIN) 300 MG capsule Take by mouth. 01/20/22   [provider]  hydrochlorothiazide (HYDRODIURIL) 25 MG tablet Take 1 tablet (25 mg total) by mouth daily. 04/19/23   Tommi Rumps, PA-C  lisinopril (ZESTRIL) 20 MG tablet Take 20 mg by mouth daily. 04/02/22   [provider]  metFORMIN (GLUCOPHAGE) 500 MG tablet Take 500 mg by mouth daily. 04/28/21   [provider]  metoprolol tartrate (LOPRESSOR) 25 MG tablet Take 1 tablet (25 mg total) by mouth 2 (two) times daily. 05/30/21   Rolly Salter, MD  oxyCODONE (OXYCONTIN) 10 mg 12 hr tablet Take 1 tablet (10 mg total) by mouth every 12 (twelve) hours. 04/19/23   Tommi Rumps, PA-C     Allergies  Morphine   Family History   Family History  Problem Relation Age of Onset   Hypertension Mother    Hypertension Father      Physical Exam  Triage Vital Signs: ED Triage Vitals  Encounter Vitals Group     BP 06/17/23 0526 (!) 175/120     Systolic BP Percentile --      Diastolic BP Percentile --      Pulse Rate 06/17/23 0526 73     Resp 06/17/23 0526 16     Temp 06/17/23 0526 98.4 F (36.9 C)     Temp Source 06/17/23 0526 Oral     SpO2 06/17/23 0526 98 %     Weight 06/17/23 0525 (!) 334 lb (151.5 kg)     Height 06/17/23 0525 6\' 2"  (1.88 m)     Head Circumference --      Peak Flow --      Pain Score 06/17/23 0525 3     Pain Loc --      Pain Education --      Exclude  from Growth Chart --     Updated Vital Signs: BP (!) 175/120   Pulse 73   Temp 98.4 F (36.9 C) (Oral)   Resp 16   Ht 6\' 2"  (1.88 m)   Wt (!) 151.5 kg   SpO2 98%   BMI 42.88 kg/m    General: Awake, no distress.  CV:  Good peripheral perfusion.  Resp:  Normal effort.  Abd:  No distention.  Other:  Right ear: Erythematous tympanic membrane without rupture.,  Some cerumen noted.   ED Results / Procedures / Treatments  Labs (all labs ordered are listed, but only abnormal results are displayed) Labs Reviewed - No data to display   EKG  None   RADIOLOGY None   Official radiology report(s): No results found.   PROCEDURES:  Critical Care performed: No  Procedures   MEDICATIONS ORDERED IN ED: Medications  amoxicillin (AMOXIL) capsule 500 mg (has no administration in time range)  lidocaine (PF) (XYLOCAINE) 1 % injection 2 mL (has no administration in time range)     IMPRESSION /  MDM / ASSESSMENT AND PLAN / ED COURSE  I reviewed the triage vital signs and the nursing notes.                             62 year old male presenting with right otalgia secondary to otitis media.  Will start amoxicillin, lidocaine eardrops and patient will follow-up with ENT as needed.  Elevated blood pressure noted; patient states he has not taken his medications yet today.  I encouraged him to do so once he gets home.  Patient denies headache, blurry vision, neck pain, chest pain, shortness of breath, nausea, vomiting or dizziness.  Patient's presentation is most consistent with acute, uncomplicated illness.   FINAL CLINICAL IMPRESSION(S) / ED DIAGNOSES   Final diagnoses:  Otalgia of right ear  Acute otitis media, unspecified otitis media type     Rx / DC Orders   ED Discharge Orders          Ordered    amoxicillin (AMOXIL) 500 MG capsule  3 times daily        06/17/23 0554             Note:  This document was prepared using Dragon voice recognition software and may include unintentional dictation errors.   Irean Hong, MD 06/17/23 [phone]

## 2023-06-17 NOTE — Discharge Instructions (Signed)
Take and finish antibiotic as prescribed.  Take your blood pressure medicines as soon as you get home this morning.  Return to the ER for recurrent or worsening symptoms, persistent vomiting, fever or other concerns.

## 2024-03-31 ENCOUNTER — Other Ambulatory Visit: Payer: Self-pay

## 2024-03-31 ENCOUNTER — Emergency Department
Admission: EM | Admit: 2024-03-31 | Discharge: 2024-03-31 | Disposition: A | Discharge status: Home / Self Care | Attending: Emergency Medicine | Admitting: Emergency Medicine

## 2024-03-31 DIAGNOSIS — S025XXA Fracture of tooth (traumatic), initial encounter for closed fracture: Secondary | ICD-10-CM | POA: Insufficient documentation

## 2024-03-31 DIAGNOSIS — X58XXXA Exposure to other specified factors, initial encounter: Secondary | ICD-10-CM | POA: Diagnosis not present

## 2024-03-31 DIAGNOSIS — S0993XA Unspecified injury of face, initial encounter: Secondary | ICD-10-CM | POA: Diagnosis present

## 2024-03-31 MED ORDER — IBUPROFEN 600 MG PO TABS
600.0000 mg | ORAL_TABLET | Freq: Once | ORAL | Status: AC
  Administered 2024-03-31: 600 mg via ORAL
  Filled 2024-03-31: qty 1

## 2024-03-31 NOTE — ED Triage Notes (Signed)
 Pt reports cracked upper tooth. Pt reports pain to area.

## 2024-03-31 NOTE — ED Provider Notes (Signed)
   [facility] Provider Note    Event Date/Time   First MD Initiated Contact with Patient 03/31/24 [phone]     (approximate)   History   Dental Pain   HPI  Jack Nichols is a 63 y.o. male presents to the emergency department with upper tooth problem.  Patient states that he noticed that his right upper tooth was loose.  Denies any trauma.  States that he is uncertain of his dentist.  Denies any significant pain.  Denies any change in voice.     Physical Exam   Triage Vital Signs: ED Triage Vitals  Encounter Vitals Group     BP 03/31/24 0252 (!) 168/81     Systolic BP Percentile --      Diastolic BP Percentile --      Pulse Rate 03/31/24 0252 79     Resp 03/31/24 0252 20     Temp 03/31/24 0252 97.8 F (36.6 C)     Temp src --      SpO2 03/31/24 0252 100 %     Weight 03/31/24 0252 (!) 347 lb (157.4 kg)     Height 03/31/24 0252 6\' 2"  (1.88 m)     Head Circumference --      Peak Flow --      Pain Score 03/31/24 0251 5     Pain Loc --      Pain Education --      Exclude from Growth Chart --     Most recent vital signs: Vitals:   03/31/24 0252  BP: (!) 168/81  Pulse: 79  Resp: 20  Temp: 97.8 F (36.6 C)  SpO2: 100%    Physical Exam Constitutional:      Appearance: He is well-developed.  HENT:     Mouth/Throat:   Eyes:     Conjunctiva/sclera: Conjunctivae normal.  Cardiovascular:     Rate and Rhythm: Regular rhythm.  Musculoskeletal:     Cervical back: Normal range of motion.  Skin:    General: Skin is warm.  Neurological:     Mental Status: He is alert. Mental status is at baseline.      IMPRESSION / MDM / ASSESSMENT AND PLAN / ED COURSE  I reviewed the triage vital signs and the nursing notes.  Clinical picture is consistent with dental subluxation of the right frontal tooth.  No surrounding abscess.  Clinical picture is not consistent with Ludwigs.  No trauma.  No concern for aspirating.  Given Motrin.  Discussed  low-cost dentist and close follow-up.  Discussed return precautions.   Labs (all labs ordered are listed, but only abnormal results are displayed) Labs interpreted as -    Labs Reviewed - No data to display       PROCEDURES:  Critical Care performed: No  Procedures  Patient's presentation is most consistent with acute, uncomplicated illness.   MEDICATIONS ORDERED IN ED: Medications  ibuprofen (ADVIL) tablet 600 mg (600 mg Oral Given 03/31/24 0511)    FINAL CLINICAL IMPRESSION(S) / ED DIAGNOSES   Final diagnoses:  Closed fracture of tooth, initial encounter     Rx / DC Orders   ED Discharge Orders     None        Note:  This document was prepared using Dragon voice recognition software and may include unintentional dictation errors.   Viviano Ground, MD 03/31/24 [phone]

## 2024-03-31 NOTE — Discharge Instructions (Addendum)
 You are seen in the emergency department for your right front tooth.  You are given information for low-cost dentist in the area.  Call tomorrow morning to establish care.  Return to the emergency department for any worsening symptoms.  Pain control:  Ibuprofen (motrin/aleve Eligah Grow) - You can take 3 tablets (600 mg) every 6 hours as needed for pain/fever.  Acetaminophen  (tylenol ) - You can take 2 extra strength tablets (1000 mg) every 6 hours as needed for pain/fever.  You can alternate these medications or take them together.  Make sure you eat food/drink water when taking these medications.

## 2024-04-06 ENCOUNTER — Ambulatory Visit: Admitting: Gerontology
